# Patient Record
Sex: Female | Born: 1980 | Race: Black or African American | Hispanic: No | Marital: Single | Smoking: Never smoker
Health system: Southern US, Community
[De-identification: ages and names within clinical notes are randomized; demographics above are authoritative.]

## PROBLEM LIST (undated history)

## (undated) DIAGNOSIS — F418 Other specified anxiety disorders: Secondary | ICD-10-CM

## (undated) DIAGNOSIS — J069 Acute upper respiratory infection, unspecified: Secondary | ICD-10-CM

## (undated) DIAGNOSIS — H6092 Unspecified otitis externa, left ear: Secondary | ICD-10-CM

## (undated) DIAGNOSIS — L509 Urticaria, unspecified: Secondary | ICD-10-CM

## (undated) DIAGNOSIS — E663 Overweight: Principal | ICD-10-CM

## (undated) DIAGNOSIS — B019 Varicella without complication: Secondary | ICD-10-CM

## (undated) DIAGNOSIS — R030 Elevated blood-pressure reading, without diagnosis of hypertension: Secondary | ICD-10-CM

## (undated) DIAGNOSIS — D649 Anemia, unspecified: Secondary | ICD-10-CM

## (undated) DIAGNOSIS — F419 Anxiety disorder, unspecified: Secondary | ICD-10-CM

## (undated) HISTORY — DX: Elevated blood-pressure reading, without diagnosis of hypertension: R03.0

## (undated) HISTORY — DX: Other specified anxiety disorders: F41.8

## (undated) HISTORY — DX: Varicella without complication: B01.9

## (undated) HISTORY — DX: Acute upper respiratory infection, unspecified: J06.9

## (undated) HISTORY — DX: Anemia, unspecified: D64.9

## (undated) HISTORY — DX: Anxiety disorder, unspecified: F41.9

## (undated) HISTORY — DX: Urticaria, unspecified: L50.9

## (undated) HISTORY — DX: Unspecified otitis externa, left ear: H60.92

## (undated) HISTORY — DX: Overweight: E66.3

---

## 2006-01-13 HISTORY — PX: DILATION AND CURETTAGE OF UTERUS: SHX78

## 2010-01-29 LAB — HM PAP SMEAR

## 2011-08-10 ENCOUNTER — Encounter: Payer: Self-pay | Admitting: Family Medicine

## 2011-08-10 ENCOUNTER — Other Ambulatory Visit: Payer: Self-pay | Admitting: Family Medicine

## 2011-08-10 ENCOUNTER — Ambulatory Visit (INDEPENDENT_AMBULATORY_CARE_PROVIDER_SITE_OTHER): Payer: 59 | Admitting: Family Medicine

## 2011-08-10 DIAGNOSIS — IMO0001 Reserved for inherently not codable concepts without codable children: Secondary | ICD-10-CM

## 2011-08-10 DIAGNOSIS — D649 Anemia, unspecified: Secondary | ICD-10-CM | POA: Insufficient documentation

## 2011-08-10 DIAGNOSIS — R030 Elevated blood-pressure reading, without diagnosis of hypertension: Secondary | ICD-10-CM

## 2011-08-10 DIAGNOSIS — Z0000 Encounter for general adult medical examination without abnormal findings: Secondary | ICD-10-CM

## 2011-08-10 DIAGNOSIS — E663 Overweight: Secondary | ICD-10-CM

## 2011-08-10 HISTORY — DX: Reserved for inherently not codable concepts without codable children: IMO0001

## 2011-08-10 HISTORY — DX: Overweight: E66.3

## 2011-08-10 NOTE — Assessment & Plan Note (Signed)
Given paperwork on DASH diet.

## 2011-08-10 NOTE — Assessment & Plan Note (Signed)
Encouraged fasting labs and consider a flud and tetanus shof.

## 2011-08-10 NOTE — Patient Instructions (Signed)

## 2011-08-10 NOTE — Assessment & Plan Note (Signed)
Consider DASH diet and increased activiy

## 2011-08-10 NOTE — Progress Notes (Signed)
Patient ID: Sara Vincent, female   DOB: 28-Mar-1981, 31 y.o.   MRN: [id number] Sara Vincent [id number] 1981-04-20 08/10/2011      Progress Note New Patient  Subjective  Chief Complaint  Chief Complaint  Patient presents with  . Establish Care    new patient    HPI  Patient is a 30yo female who is in today for new patient appointment. She is  struggling with fatigue and malaise. , Palpitations, shortness of breath. At home with her 90-year-old daughter her parents. And had her last Pap over a year ago. Is not having any GYN or breast complaints. During her pregnancy she did have preeclampsia but that result.  Past Medical History  Diagnosis Date  . Chicken pox as a child  . Anemia   . Overweight 08/10/2011  . Elevated BP 08/10/2011    Past Surgical History  Procedure Date  . Dilation and curettage of uterus 01-2006    Family History  Problem Relation Age of Onset  . Hypertension Mother   . Cancer Father 4    prostate- remission- surgery to remove  . Heart disease Maternal Grandfather   . Hypertension Maternal Grandfather   . Hyperlipidemia Maternal Grandfather   . Stroke Paternal Grandmother   . Cancer Maternal Aunt     breast  . Cancer Paternal Aunt     breast    History   Social History  . Marital Status: Single    Spouse Name: N/A    Number of Children: N/A  . Years of Education: N/A   Occupational History  . Not on file.   Social History Main Topics  . Smoking status: Never Smoker   . Smokeless tobacco: Never Used  . Alcohol Use: Yes     occasionally  . Drug Use: No  . Sexually Active: No   Other Topics Concern  . Not on file   Social History Narrative  . No narrative on file    No current outpatient prescriptions on file prior to visit.    No Known Allergies  Review of Systems  Review of Systems  Constitutional: Negative for fever and malaise/fatigue.  HENT: Negative for congestion.   Eyes: Negative for discharge.  Respiratory: Negative  for shortness of breath.   Cardiovascular: Negative for chest pain, palpitations and leg swelling.  Gastrointestinal: Negative for nausea, abdominal pain and diarrhea.  Genitourinary: Negative for dysuria.  Musculoskeletal: Negative for falls.  Skin: Negative for rash.  Neurological: Negative for loss of consciousness and headaches.  Endo/Heme/Allergies: Negative for polydipsia.  Psychiatric/Behavioral: Negative for depression and suicidal ideas. The patient is not nervous/anxious and does not have insomnia.      Objective  BP 138/91  Pulse 79  Temp(Src) 98.2 F (36.8 C) (Temporal)  Ht 5\' 4"  (1.626 m)  Wt 206 lb 1.9 oz (93.495 kg)  BMI 35.38 kg/m2  SpO2 99%  LMP 08/10/2011  Physical Exam  Physical Exam     Assessment & Plan  Preventative health care Encouraged fasting labs and consider a flud and tetanus shof.  Elevated BP Consider DASH diet and increased activiy  Overweight Given paperwork on DASH diet.

## 2011-08-11 LAB — HEPATIC FUNCTION PANEL
ALT: 10 U/L (ref 0–35)
Albumin: 4.2 g/dL (ref 3.5–5.2)
Indirect Bilirubin: 0.3 mg/dL (ref 0.0–0.9)
Total Protein: 7.9 g/dL (ref 6.0–8.3)

## 2011-08-11 LAB — BASIC METABOLIC PANEL
Chloride: 105 mEq/L (ref 96–112)
Potassium: 3.9 mEq/L (ref 3.5–5.3)
Sodium: 138 mEq/L (ref 135–145)

## 2011-08-11 LAB — CBC
MCHC: 33.1 g/dL (ref 30.0–36.0)
Platelets: 316 10*3/uL (ref 150–400)
RDW: 12.1 % (ref 11.5–15.5)
WBC: 5.1 10*3/uL (ref 4.0–10.5)

## 2011-08-11 LAB — LIPID PANEL
Cholesterol: 151 mg/dL (ref 0–200)
HDL: 49 mg/dL (ref >39)
LDL Cholesterol: 83 mg/dL (ref 0–99)
Total CHOL/HDL Ratio: 3.1 Ratio
Triglycerides: 93 mg/dL (ref <150)
VLDL: 19 mg/dL (ref 0–40)

## 2011-08-11 LAB — PHOSPHORUS: Phosphorus: 2.7 mg/dL (ref 2.3–4.6)

## 2011-08-11 LAB — TSH: TSH: 0.939 u[IU]/mL (ref 0.350–4.500)

## 2011-09-07 ENCOUNTER — Ambulatory Visit: Payer: 59 | Admitting: Family Medicine

## 2011-09-25 ENCOUNTER — Other Ambulatory Visit (HOSPITAL_COMMUNITY)
Admission: RE | Admit: 2011-09-25 | Discharge: 2011-09-25 | Disposition: A | Discharge status: Home / Self Care | Payer: 59 | Source: Ambulatory Visit | Attending: Family Medicine | Admitting: Family Medicine

## 2011-09-25 ENCOUNTER — Ambulatory Visit (INDEPENDENT_AMBULATORY_CARE_PROVIDER_SITE_OTHER): Payer: 59 | Admitting: Family Medicine

## 2011-09-25 ENCOUNTER — Encounter: Payer: Self-pay | Admitting: Family Medicine

## 2011-09-25 VITALS — BP 116/79 | HR 74 | Temp 98.9°F | Ht 64.0 in | Wt 206.1 lb

## 2011-09-25 DIAGNOSIS — E663 Overweight: Secondary | ICD-10-CM

## 2011-09-25 DIAGNOSIS — R030 Elevated blood-pressure reading, without diagnosis of hypertension: Secondary | ICD-10-CM

## 2011-09-25 DIAGNOSIS — Z0000 Encounter for general adult medical examination without abnormal findings: Secondary | ICD-10-CM

## 2011-09-25 DIAGNOSIS — Z124 Encounter for screening for malignant neoplasm of cervix: Secondary | ICD-10-CM

## 2011-09-25 DIAGNOSIS — D649 Anemia, unspecified: Secondary | ICD-10-CM

## 2011-09-25 DIAGNOSIS — Z01419 Encounter for gynecological examination (general) (routine) without abnormal findings: Secondary | ICD-10-CM | POA: Insufficient documentation

## 2011-09-25 NOTE — Assessment & Plan Note (Signed)
Very mild, continue dark leafy greens and lean red meats intermittently

## 2011-09-25 NOTE — Assessment & Plan Note (Signed)
Encouraged annual paps, self breast exam, heart healthy diet, regular exercise

## 2011-09-25 NOTE — Assessment & Plan Note (Signed)
Avoid trans fats, decrease po intake and increase exercise, check tsh

## 2011-09-25 NOTE — Progress Notes (Signed)
Patient ID: Sara Vincent, female   DOB: 09-11-1980, 31 y.o.   MRN: [id number] Sara Vincent [id number] December 10, 1980 09/25/2011      Progress Note New Patient  Subjective  Chief Complaint  Chief Complaint  Patient presents with  . Gynecologic Exam    pap  . Annual Exam    physical    HPI  Patient is a 31 year old female who is in today for followup on new patient appointment and GYN exam. She reports she is doing well. No recent illness, fevers, chills, GI or GU complaints. Her bowels are moving regularly. She is denying any GYN complaints. No discharge or lesions. She's not been sexually active since 2001. She denies any breast complaints lumps bumps or concerns. She continues to show appropriate he is walking he does note overall she feels better. Her level of the aunt her joints feel better no concerns.  Past Medical History  Diagnosis Date  . Chicken pox as a child  . Anemia   . Overweight 08/10/2011  . Elevated BP 08/10/2011    Past Surgical History  Procedure Date  . Dilation and curettage of uterus 01-2006    Family History  Problem Relation Age of Onset  . Hypertension Mother   . Cancer Father 71    prostate- remission- surgery to remove  . Heart disease Maternal Grandfather   . Hypertension Maternal Grandfather   . Hyperlipidemia Maternal Grandfather   . Stroke Paternal Grandmother   . Cancer Maternal Aunt     breast  . Cancer Paternal Aunt     breast    History   Social History  . Marital Status: Single    Spouse Name: N/A    Number of Children: N/A  . Years of Education: N/A   Occupational History  . Not on file.   Social History Main Topics  . Smoking status: Never Smoker   . Smokeless tobacco: Never Used  . Alcohol Use: Yes     occasionally  . Drug Use: No  . Sexually Active: No   Other Topics Concern  . Not on file   Social History Narrative  . No narrative on file    Current Outpatient Prescriptions on File Prior to Visit  Medication  Sig Dispense Refill  . fish oil-omega-3 fatty acids 1000 MG capsule Take 2 g by mouth daily.      . folic acid (FOLVITE) 1 MG tablet Take 1 mg by mouth daily.      . IUD's (PARAGARD INTRAUTERINE COPPER IU) by Intrauterine route. Due to come out May 2020      . multivitamin (THERAGRAN) per tablet Take 1 tablet by mouth daily.      . vitamin B-12 (CYANOCOBALAMIN) 500 MCG tablet Take 500 mcg by mouth daily.        No Known Allergies  Review of Systems  Review of Systems  Constitutional: Positive for malaise/fatigue. Negative for fever and chills.  HENT: Negative for hearing loss, nosebleeds and congestion.   Eyes: Negative for discharge.  Respiratory: Negative for cough, sputum production, shortness of breath and wheezing.   Cardiovascular: Negative for chest pain, palpitations and leg swelling.  Gastrointestinal: Negative for heartburn, nausea, vomiting, abdominal pain, diarrhea, constipation and blood in stool.  Genitourinary: Negative for dysuria, urgency, frequency and hematuria.  Musculoskeletal: Negative for myalgias, back pain and falls.  Skin: Negative for rash.  Neurological: Negative for dizziness, tremors, sensory change, focal weakness, loss of consciousness, weakness and headaches.  Endo/Heme/Allergies: Negative  for polydipsia. Does not bruise/bleed easily.  Psychiatric/Behavioral: Negative for depression and suicidal ideas. The patient is not nervous/anxious and does not have insomnia.     Objective  BP 116/79  Pulse 74  Temp(Src) 98.9 F (37.2 C) (Temporal)  Ht 5\' 4"  (1.626 m)  Wt 206 lb 1.9 oz (93.495 kg)  BMI 35.38 kg/m2  SpO2 97%  LMP 09/06/2011  Physical Exam  Physical Exam  Constitutional: She is oriented to person, place, and time and well-developed, well-nourished, and in no distress. No distress.  HENT:  Head: Normocephalic and atraumatic.  Right Ear: External ear normal.  Left Ear: External ear normal.  Nose: Nose normal.  Mouth/Throat: Oropharynx  is clear and moist. No oropharyngeal exudate.  Eyes: Conjunctivae are normal. Pupils are equal, round, and reactive to light. Right eye exhibits no discharge. Left eye exhibits no discharge. No scleral icterus.  Neck: Normal range of motion. Neck supple. No thyromegaly present.  Cardiovascular: Normal rate, regular rhythm, normal heart sounds and intact distal pulses.   No murmur heard. Pulmonary/Chest: Effort normal and breath sounds normal. No respiratory distress. She has no wheezes. She has no rales.  Abdominal: Soft. Bowel sounds are normal. She exhibits no distension and no mass. There is no tenderness.  Genitourinary: Uterus normal, cervix normal, right adnexa normal and left adnexa normal. No vaginal discharge found.       String from uterus seen in cervix  Musculoskeletal: Normal range of motion. She exhibits no edema and no tenderness.  Lymphadenopathy:    She has no cervical adenopathy.  Neurological: She is alert and oriented to person, place, and time. She has normal reflexes. No cranial nerve deficit. Coordination normal.  Skin: Skin is warm and dry. No rash noted. She is not diaphoretic.  Psychiatric: Mood, memory and affect normal.       Assessment & Plan  Cervical cancer screening No GYN concerns, IUD in place. Not sexually active for the past 2 years and no concerns. Pap taken today, instructed to do self breast exam monthly.  Elevated BP Improved at today's visit. Continue to walk and minimize sodium  Overweight Avoid trans fats, decrease po intake and increase exercise, check tsh  Anemia Very mild, continue dark leafy greens and lean red meats intermittently  Preventative health care Encouraged annual paps, self breast exam, heart healthy diet, regular exercise

## 2011-09-25 NOTE — Assessment & Plan Note (Addendum)
Improved at today's visit. Continue to walk and minimize sodium

## 2011-09-25 NOTE — Assessment & Plan Note (Addendum)
No GYN concerns, IUD in place. Not sexually active for the past 2 years and no concerns. Pap taken today, instructed to do self breast exam monthly.

## 2011-09-25 NOTE — Patient Instructions (Signed)

## 2011-10-16 ENCOUNTER — Encounter: Payer: Self-pay | Admitting: Family Medicine

## 2011-10-16 ENCOUNTER — Ambulatory Visit (INDEPENDENT_AMBULATORY_CARE_PROVIDER_SITE_OTHER): Payer: 59 | Admitting: Family Medicine

## 2011-10-16 VITALS — BP 143/100 | HR 80 | Temp 98.2°F | Ht 64.0 in | Wt 212.8 lb

## 2011-10-16 DIAGNOSIS — E663 Overweight: Secondary | ICD-10-CM

## 2011-10-16 DIAGNOSIS — H60399 Other infective otitis externa, unspecified ear: Secondary | ICD-10-CM

## 2011-10-16 DIAGNOSIS — H6090 Unspecified otitis externa, unspecified ear: Secondary | ICD-10-CM

## 2011-10-16 DIAGNOSIS — R030 Elevated blood-pressure reading, without diagnosis of hypertension: Secondary | ICD-10-CM

## 2011-10-16 DIAGNOSIS — H6690 Otitis media, unspecified, unspecified ear: Secondary | ICD-10-CM

## 2011-10-16 DIAGNOSIS — H6092 Unspecified otitis externa, left ear: Secondary | ICD-10-CM

## 2011-10-16 DIAGNOSIS — IMO0001 Reserved for inherently not codable concepts without codable children: Secondary | ICD-10-CM

## 2011-10-16 HISTORY — DX: Unspecified otitis externa, left ear: H60.92

## 2011-10-16 MED ORDER — AMOXICILLIN-POT CLAVULANATE 875-125 MG PO TABS: 1.0000 | ORAL_TABLET | Freq: Two times a day (BID) | ORAL | Status: AC

## 2011-10-16 MED ORDER — CETIRIZINE HCL 10 MG PO TABS: 10.0000 mg | ORAL_TABLET | Freq: Every day | ORAL | Status: AC

## 2011-10-16 MED ORDER — ANTIPYRINE-BENZOCAINE 5.4-1.4 % OT SOLN: 3.0000 [drp] | OTIC | Status: AC | PRN

## 2011-10-16 NOTE — Assessment & Plan Note (Signed)
Some weight gain noted since last visit, encouraged increased exercise and decreased po intake

## 2011-10-16 NOTE — Progress Notes (Signed)
Patient ID: Sara Vincent, female   DOB: [birth year], 31 y.o.   MRN: [id number] Sara Vincent [id number] 08/28/1980 10/16/2011      Progress Note-Follow Up  Subjective  Chief Complaint  Chief Complaint  Patient presents with  . Otitis Media    X 5 days-left ear swollen- can't hear out of it    HPI  Patient is a 31 year old [place] American female who is in today with a five-day history of painful swelling in her left ear with decreased hearing. Last week her daughter at about the neurovirus. Then over the weekend the patient herself began to develop a sour stomach and abdominal pain. She had roughly 4 episodes of vomiting and some intermittent loose stool. Had poor appetite and abdominal discomfort most of the week. The symptoms largely resolved but then fatigue started 3-4 days ago and left ear pain occurred at the same time. Over the last several days the ear has become increasingly swollen and uncomfortable. She has had decreased hearing and a sense of popping as well she also notes that allergies are bad this year with itchy watery eyes and nose and increased head congestion. She denies sore throat, chest pain, palpitations, shortness of breath, GU complaints at this time.  Past Medical History  Diagnosis Date  . Chicken pox as a child  . Anemia   . Overweight 08/10/2011  . Elevated BP 08/10/2011  . Otitis externa of left ear 10/16/2011    Past Surgical History  Procedure Date  . Dilation and curettage of uterus 01-2006    Family History  Problem Relation Age of Onset  . Hypertension Mother   . Cancer Father 47    prostate- remission- surgery to remove  . Heart disease Maternal Grandfather   . Hypertension Maternal Grandfather   . Hyperlipidemia Maternal Grandfather   . Stroke Paternal Grandmother   . Cancer Maternal Aunt     breast  . Cancer Paternal Aunt     breast    History   Social History  . Marital Status: Single    Spouse Name: N/A    Number of Children: N/A  .  Years of Education: N/A   Occupational History  . Not on file.   Social History Main Topics  . Smoking status: Never Smoker   . Smokeless tobacco: Never Used  . Alcohol Use: Yes     occasionally  . Drug Use: No  . Sexually Active: No   Other Topics Concern  . Not on file   Social History Narrative  . No narrative on file    Current Outpatient Prescriptions on File Prior to Visit  Medication Sig Dispense Refill  . folic acid (FOLVITE) 1 MG tablet Take 1 mg by mouth daily.      . IUD's (PARAGARD INTRAUTERINE COPPER IU) by Intrauterine route. Due to come out May 2020      . multivitamin (THERAGRAN) per tablet Take 1 tablet by mouth daily.      . vitamin B-12 (CYANOCOBALAMIN) 500 MCG tablet Take 500 mcg by mouth daily.      . cetirizine (ZYRTEC) 10 MG tablet Take 1 tablet (10 mg total) by mouth daily.  30 tablet  11  . fish oil-omega-3 fatty acids 1000 MG capsule Take 2 g by mouth daily.        No Known Allergies  Review of Systems  Review of Systems  Constitutional: Negative for fever and malaise/fatigue.  HENT: Positive for ear pain, congestion and ear  discharge. Negative for sore throat.   Eyes: Negative for discharge.  Respiratory: Negative for cough and shortness of breath.   Cardiovascular: Negative for chest pain, palpitations and leg swelling.  Gastrointestinal: Positive for nausea, vomiting and diarrhea. Negative for abdominal pain, constipation and blood in stool.       Had norovirus earlier in the week, with n/v/diarrhea and abdominal pain, these symptoms have largely resolved  Genitourinary: Negative for dysuria.  Musculoskeletal: Negative for falls.  Skin: Negative for rash.  Neurological: Negative for loss of consciousness and headaches.  Endo/Heme/Allergies: Negative for polydipsia.  Psychiatric/Behavioral: Negative for depression and suicidal ideas. The patient is not nervous/anxious and does not have insomnia.     Objective  BP 143/100  Pulse 80   Temp(Src) 98.2 F (36.8 C) (Temporal)  Ht 5\' 4"  (1.626 m)  Wt 212 lb 12.8 oz (96.525 kg)  BMI 36.53 kg/m2  SpO2 99%  LMP 10/03/2011  Physical Exam  Physical Exam  Constitutional: She is oriented to person, place, and time and well-developed, well-nourished, and in no distress. No distress.  HENT:  Head: Normocephalic and atraumatic.  Left Ear: External ear normal.       Oropharynx mildly erythematous. Left ear, tragus and external canal swollen and red. Canal obstructed by cerumen. Posterior and anterior cervical lymphadenopathy  Eyes: Conjunctivae are normal.  Neck: Neck supple. No thyromegaly present.  Cardiovascular: Normal rate, regular rhythm and normal heart sounds.   No murmur heard. Pulmonary/Chest: Effort normal and breath sounds normal. She has no wheezes.  Abdominal: She exhibits no distension and no mass.  Musculoskeletal: She exhibits no edema.  Lymphadenopathy:    She has cervical adenopathy.  Neurological: She is alert and oriented to person, place, and time.  Skin: Skin is warm and dry. No rash noted. She is not diaphoretic.  Psychiatric: Memory, affect and judgment normal.    Lab Results  Component Value Date   TSH 0.939 08/10/2011   Lab Results  Component Value Date   WBC 5.1 08/10/2011   HGB 12.0 08/10/2011   HCT 36.3 08/10/2011   MCV 87.1 08/10/2011   PLT 316 08/10/2011   Lab Results  Component Value Date   CREATININE 0.63 08/10/2011   BUN 6 08/10/2011   NA 138 08/10/2011   K 3.9 08/10/2011   CL 105 08/10/2011   CO2 23 08/10/2011   Lab Results  Component Value Date   ALT 10 08/10/2011   AST 14 08/10/2011   ALKPHOS 59 08/10/2011   BILITOT 0.4 08/10/2011   Lab Results  Component Value Date   CHOL 151 08/10/2011   Lab Results  Component Value Date   HDL 49 08/10/2011   Lab Results  Component Value Date   LDLCALC 83 08/10/2011   Lab Results  Component Value Date   TRIG 93 08/10/2011   Lab Results  Component Value Date   CHOLHDL 3.1 08/10/2011      Assessment & Plan  Otitis externa of left ear Possible OM with significant lymphadenopathy in surrounding area. Started on Augmentin bid and auralgan drops. Start a probiotic and instructed how to flush her ear of the cerumen when she feels better. If she is unsuccessful or not improving she will notify us  Overweight Some weight gain noted since last visit, encouraged increased exercise and decreased po intake  Elevated BP Improved on repeat but still up encouraged her to minimize sodium attempt moderate weight loss and recheck in 3 months or as needed

## 2011-10-16 NOTE — Patient Instructions (Addendum)
Otitis Externa Otitis externa ("swimmer's ear") is a germ (bacterial) or fungal infection of the outer ear canal (from the eardrum to the outside of the ear). Swimming in dirty water may cause swimmer's ear. It also may be caused by moisture in the ear from water remaining after swimming or bathing. Often the first signs of infection may be itching in the ear canal. This may progress to ear canal swelling, redness, and pus drainage, which may be signs of infection. HOME CARE INSTRUCTIONS   Apply the antibiotic drops to the ear canal as prescribed by your doctor.   This can be a very painful medical condition. A strong pain reliever may be prescribed.   Only take over-the-counter or prescription medicines for pain, discomfort, or fever as directed by your caregiver.   If your caregiver has given you a follow-up appointment, it is very important to keep that appointment. Not keeping the appointment could result in a chronic or permanent injury, pain, hearing loss and disability. If there is any problem keeping the appointment, you must call back to this facility for assistance.  PREVENTION   It is important to keep your ear dry. Use the corner of a towel to wick water out of the ear canal after swimming or bathing.   Avoid scratching in your ear. This can damage the ear canal or remove the protective wax lining the canal and make it easier for germs (bacteria) or a fungus to grow.   You may use ear drops made of rubbing alcohol and vinegar after swimming to prevent future "swimmer's ear" infections. Make up a small bottle of equal parts white vinegar and alcohol. Put 3 or 4 drops into each ear after swimming.   Avoid swimming in lakes, polluted water, or poorly chlorinated pools.  SEEK MEDICAL CARE IF:   An oral temperature above 102 F (38.9 C) develops.   Your ear is still painful after 3 days and shows signs of getting worse (redness, swelling, pain, or pus).  MAKE SURE YOU:   Understand  these instructions.   Will watch your condition.   Will get help right away if you are not doing well or get worse.  Document Released: 06/01/2005 Document Revised: 05/21/2011 Document Reviewed: 01/06/2008 [facility] Patient Information 2012 [place], [place]. Probiotic capsule daily, yogurt daily

## 2011-10-16 NOTE — Assessment & Plan Note (Signed)
Improved on repeat but still up encouraged her to minimize sodium attempt moderate weight loss and recheck in 3 months or as needed

## 2011-10-16 NOTE — Assessment & Plan Note (Signed)
Possible OM with significant lymphadenopathy in surrounding area. Started on Augmentin bid and auralgan drops. Start a probiotic and instructed how to flush her ear of the cerumen when she feels better. If she is unsuccessful or not improving she will notify us

## 2012-04-04 ENCOUNTER — Ambulatory Visit: Payer: 59 | Admitting: Family Medicine

## 2012-04-04 ENCOUNTER — Other Ambulatory Visit (HOSPITAL_COMMUNITY)
Admission: RE | Admit: 2012-04-04 | Discharge: 2012-04-04 | Disposition: A | Discharge status: Home / Self Care | Payer: 59 | Source: Ambulatory Visit | Attending: Family Medicine | Admitting: Family Medicine

## 2012-04-04 ENCOUNTER — Ambulatory Visit (INDEPENDENT_AMBULATORY_CARE_PROVIDER_SITE_OTHER): Payer: 59 | Admitting: Family Medicine

## 2012-04-04 ENCOUNTER — Encounter: Payer: Self-pay | Admitting: Family Medicine

## 2012-04-04 VITALS — BP 128/81 | HR 90 | Temp 98.2°F | Ht 64.0 in | Wt 217.0 lb

## 2012-04-04 DIAGNOSIS — Z124 Encounter for screening for malignant neoplasm of cervix: Secondary | ICD-10-CM

## 2012-04-04 DIAGNOSIS — R030 Elevated blood-pressure reading, without diagnosis of hypertension: Secondary | ICD-10-CM

## 2012-04-04 DIAGNOSIS — Z113 Encounter for screening for infections with a predominantly sexual mode of transmission: Secondary | ICD-10-CM | POA: Insufficient documentation

## 2012-04-04 DIAGNOSIS — N760 Acute vaginitis: Secondary | ICD-10-CM

## 2012-04-04 DIAGNOSIS — R109 Unspecified abdominal pain: Secondary | ICD-10-CM

## 2012-04-04 DIAGNOSIS — R509 Fever, unspecified: Secondary | ICD-10-CM

## 2012-04-04 LAB — RPR

## 2012-04-04 LAB — HIV ANTIBODY (ROUTINE TESTING W REFLEX): HIV: NONREACTIVE

## 2012-04-04 NOTE — Patient Instructions (Addendum)
Intrauterine Device Information  An intrauterine device (IUD) is inserted into your uterus and prevents pregnancy. There are 2 types of IUDs available:  · Copper IUD. This type of IUD is wrapped in copper wire and is placed inside the uterus. Copper makes the uterus and fallopian tubes produce a fluid that kills sperm. The copper IUD can stay in place for 10 years.  · Hormone IUD. This type of IUD contains the hormone progestin (synthetic progesterone). The hormone thickens the cervical mucus and prevents sperm from entering the uterus, and it also thins the uterine lining to prevent implantation of a fertilized egg. The hormone can weaken or kill the sperm that get into the uterus. The hormone IUD can stay in place for 5 years.  Your caregiver will make sure you are a good candidate for a contraceptive IUD. Discuss with your caregiver the possible side effects.  ADVANTAGES  · It is highly effective, reversible, long-acting, and low maintenance.  · There are no estrogen-related side effects.  · An IUD can be used when breastfeeding.  · It is not associated with weight gain.  · It works immediately after insertion.  · The copper IUD does not interfere with your female hormones.  · The progesterone IUD can make heavy menstrual periods lighter.  · The progesterone IUD can be used for 5 years.  · The copper IUD can be used for 10 years.  DISADVANTAGES  · The progesterone IUD can be associated with irregular bleeding patterns.  · The copper IUD can make your menstrual flow heavier and more painful.  · You may experience cramping and vaginal bleeding after insertion.  Document Released: 05/05/2004 Document Revised: 08/24/2011 Document Reviewed: 10/04/2010  [employer]® Patient Information ©2013 [employer].

## 2012-04-07 MED ORDER — METRONIDAZOLE 500 MG PO TABS: 500.0000 mg | ORAL_TABLET | Freq: Two times a day (BID) | ORAL | Status: DC

## 2012-04-07 NOTE — Progress Notes (Signed)
Quick Note:  Patient Informed and voiced understanding ______ 

## 2012-04-10 ENCOUNTER — Encounter: Payer: Self-pay | Admitting: Family Medicine

## 2012-04-10 NOTE — Assessment & Plan Note (Signed)
Well controlled today.

## 2012-04-10 NOTE — Assessment & Plan Note (Signed)
iud removed today, tolerated well. Patient is going to try going without any birth control. She is getting married and considering a future pregnancy. Cultures taken today.

## 2012-04-10 NOTE — Progress Notes (Signed)
Patient ID: Sara Vincent, female   DOB: 02-19-1981, 31 y.o.   MRN: [id number] DRAVEN MERANTE [id number] 1981/03/29 04/10/2012      Progress Note New Patient  Subjective  Chief Complaint  Chief Complaint  Patient presents with  . IUD removal    HPI  Patient is a 31 year old [place] American female who is here today to have her IUD removed. She is getting married soon is considering another pregnancy. She does have some vaginal discharge but it is not have a significant odor or color. She is actively with a single partner. Denies abdominal or back pain. No GI or GU complaints. No headaches, chest pain, palpitations, shortness of breath.  Past Medical History  Diagnosis Date  . Chicken pox as a child  . Anemia   . Overweight 08/10/2011  . Elevated BP 08/10/2011  . Otitis externa of left ear 10/16/2011    Past Surgical History  Procedure Date  . Dilation and curettage of uterus 01-2006    Family History  Problem Relation Age of Onset  . Hypertension Mother   . Cancer Father 16    prostate- remission- surgery to remove  . Heart disease Maternal Grandfather   . Hypertension Maternal Grandfather   . Hyperlipidemia Maternal Grandfather   . Stroke Paternal Grandmother   . Cancer Maternal Aunt     breast  . Cancer Paternal Aunt     breast    History   Social History  . Marital Status: Single    Spouse Name: N/A    Number of Children: N/A  . Years of Education: N/A   Occupational History  . Not on file.   Social History Main Topics  . Smoking status: Never Smoker   . Smokeless tobacco: Never Used  . Alcohol Use: Yes     occasionally  . Drug Use: No  . Sexually Active: No   Other Topics Concern  . Not on file   Social History Narrative  . No narrative on file    Current Outpatient Prescriptions on File Prior to Visit  Medication Sig Dispense Refill  . cetirizine (ZYRTEC) 10 MG tablet Take 1 tablet (10 mg total) by mouth daily.  30 tablet  11  . fish oil-omega-3  fatty acids 1000 MG capsule Take 2 g by mouth daily.      . folic acid (FOLVITE) 1 MG tablet Take 1 mg by mouth daily.      . multivitamin (THERAGRAN) per tablet Take 1 tablet by mouth daily.      . vitamin B-12 (CYANOCOBALAMIN) 500 MCG tablet Take 500 mcg by mouth daily.        No Known Allergies  Review of Systems  Review of Systems  Constitutional: Negative for fever and malaise/fatigue.  HENT: Negative for congestion.   Eyes: Negative for discharge.  Respiratory: Negative for shortness of breath.   Cardiovascular: Negative for chest pain, palpitations and leg swelling.  Gastrointestinal: Negative for nausea, abdominal pain and diarrhea.  Genitourinary: Negative for dysuria.  Musculoskeletal: Negative for falls.  Skin: Negative for rash.  Neurological: Negative for loss of consciousness and headaches.  Endo/Heme/Allergies: Negative for polydipsia.  Psychiatric/Behavioral: Negative for depression and suicidal ideas. The patient is not nervous/anxious and does not have insomnia.     Objective  BP 128/81  Pulse 90  Temp 98.2 F (36.8 C) (Temporal)  Ht 5\' 4"  (1.626 m)  Wt 217 lb (98.431 kg)  BMI 37.25 kg/m2  SpO2 100%  LMP 03/19/2012  Physical Exam  Physical Exam  Constitutional: She is oriented to person, place, and time and well-developed, well-nourished, and in no distress. No distress.  HENT:  Head: Normocephalic and atraumatic.  Eyes: Conjunctivae normal are normal.  Neck: Neck supple. No thyromegaly present.  Cardiovascular: Normal rate, regular rhythm and normal heart sounds.   No murmur heard. Pulmonary/Chest: Effort normal and breath sounds normal. She has no wheezes.  Abdominal: She exhibits no distension and no mass.  Genitourinary: Uterus normal, cervix normal, right adnexa normal and left adnexa normal. No vaginal discharge found.       Thick white discharge  Musculoskeletal: She exhibits no edema.  Lymphadenopathy:    She has no cervical adenopathy.    Neurological: She is alert and oriented to person, place, and time.  Skin: Skin is warm and dry. No rash noted. She is not diaphoretic.  Psychiatric: Memory, affect and judgment normal.       Assessment & Plan  Cervical cancer screening iud removed today, tolerated well. Patient is going to try going without any birth control. She is getting married and considering a future pregnancy. Cultures taken today.  Elevated BP Well controlled today

## 2012-06-28 ENCOUNTER — Encounter: Payer: Self-pay | Admitting: Family Medicine

## 2012-06-28 ENCOUNTER — Ambulatory Visit (INDEPENDENT_AMBULATORY_CARE_PROVIDER_SITE_OTHER): Payer: 59 | Admitting: Family Medicine

## 2012-06-28 VITALS — BP 131/81 | HR 102 | Temp 98.9°F | Ht 64.0 in | Wt 217.4 lb

## 2012-06-28 DIAGNOSIS — R030 Elevated blood-pressure reading, without diagnosis of hypertension: Secondary | ICD-10-CM

## 2012-06-28 DIAGNOSIS — F411 Generalized anxiety disorder: Secondary | ICD-10-CM

## 2012-06-28 DIAGNOSIS — F419 Anxiety disorder, unspecified: Secondary | ICD-10-CM

## 2012-06-28 MED ORDER — ALPRAZOLAM 0.25 MG PO TABS: 0.2500 mg | ORAL_TABLET | Freq: Two times a day (BID) | ORAL | Status: DC | PRN

## 2012-06-28 NOTE — Patient Instructions (Addendum)

## 2012-06-29 ENCOUNTER — Encounter: Payer: Self-pay | Admitting: Family Medicine

## 2012-06-29 DIAGNOSIS — F418 Other specified anxiety disorders: Secondary | ICD-10-CM | POA: Insufficient documentation

## 2012-06-29 DIAGNOSIS — F419 Anxiety disorder, unspecified: Secondary | ICD-10-CM

## 2012-06-29 HISTORY — DX: Anxiety disorder, unspecified: F41.9

## 2012-06-29 HISTORY — DX: Other specified anxiety disorders: F41.8

## 2012-06-29 NOTE — Progress Notes (Signed)
Patient ID: Sara Vincent, female   DOB: Oct 30, 1980, 32 y.o.   MRN: [id number] Sara Vincent [id number] 1980-08-03 06/29/2012      Progress Note-Follow Up  Subjective  Chief Complaint  Chief Complaint  Patient presents with  . Stress    at work    HPI  Patient is a 32 year old American female who is in today to discuss worsening stressors. She is working in a very hostile work environment and under great stress. She cries easily. She's been having daily panic attacks recently palpitations shortness of breath tremulousness even some tingling in her fingertips. She has never experienced this before. She denies active depression but acknowledges she is struggling with some anhedonia. No other acute illness or other physical changes are noted. No GI or GU complaints. No fevers congestion headaches. Work became significantly more stressful back in September and initially she felt she was getting through it okay but at this point she is having a hard time coping.  Past Medical History  Diagnosis Date  . Chicken pox as a child  . Anemia   . Overweight 08/10/2011  . Elevated BP 08/10/2011  . Otitis externa of left ear 10/16/2011  . Anxiety 06/29/2012    Past Surgical History  Procedure Date  . Dilation and curettage of uterus 01-2006    Family History  Problem Relation Age of Onset  . Hypertension Mother   . Cancer Father 49    prostate- remission- surgery to remove  . Heart disease Maternal Grandfather   . Hypertension Maternal Grandfather   . Hyperlipidemia Maternal Grandfather   . Stroke Paternal Grandmother   . Cancer Maternal Aunt     breast  . Cancer Paternal Aunt     breast    History   Social History  . Marital Status: Single    Spouse Name: N/A    Number of Children: N/A  . Years of Education: N/A   Occupational History  . Not on file.   Social History Main Topics  . Smoking status: Never Smoker   . Smokeless tobacco: Never Used  . Alcohol Use: Yes     Comment:  occasionally  . Drug Use: No  . Sexually Active: No   Other Topics Concern  . Not on file   Social History Narrative  . No narrative on file    Current Outpatient Prescriptions on File Prior to Visit  Medication Sig Dispense Refill  . cetirizine (ZYRTEC) 10 MG tablet Take 1 tablet (10 mg total) by mouth daily.  30 tablet  11  . folic acid (FOLVITE) 1 MG tablet Take 1 mg by mouth daily.      . multivitamin (THERAGRAN) per tablet Take 1 tablet by mouth daily.      . vitamin B-12 (CYANOCOBALAMIN) 500 MCG tablet Take 500 mcg by mouth daily.        No Known Allergies  Review of Systems  Review of Systems  Constitutional: Negative for fever and malaise/fatigue.  HENT: Negative for congestion.   Eyes: Negative for discharge.  Respiratory: Positive for shortness of breath.   Cardiovascular: Positive for palpitations. Negative for chest pain and leg swelling.  Gastrointestinal: Negative for nausea, abdominal pain and diarrhea.  Genitourinary: Negative for dysuria.  Musculoskeletal: Negative for falls.  Skin: Negative for rash.  Neurological: Negative for loss of consciousness and headaches.  Endo/Heme/Allergies: Negative for polydipsia.  Psychiatric/Behavioral: Positive for depression. Negative for suicidal ideas. The patient is nervous/anxious. The patient does not have insomnia.  Objective  BP 131/81  Pulse 102  Temp 98.9 F (37.2 C) (Temporal)  Ht 5\' 4"  (1.626 m)  Wt 217 lb 6.4 oz (98.612 kg)  BMI 37.32 kg/m2  SpO2 99%  Physical Exam  Physical Exam  Constitutional: She is oriented to person, place, and time and well-developed, well-nourished, and in no distress. No distress.  HENT:  Head: Normocephalic and atraumatic.  Eyes: Conjunctivae normal are normal.  Neck: Neck supple. No thyromegaly present.  Cardiovascular: Normal rate, regular rhythm and normal heart sounds.   No murmur heard. Pulmonary/Chest: Effort normal and breath sounds normal. She has no  wheezes.  Abdominal: She exhibits no distension and no mass.  Musculoskeletal: She exhibits no edema.  Lymphadenopathy:    She has no cervical adenopathy.  Neurological: She is alert and oriented to person, place, and time.  Skin: Skin is warm and dry. No rash noted. She is not diaphoretic.  Psychiatric: Memory, affect and judgment normal.       Crying easily in visit.    Lab Results  Component Value Date   TSH 0.939 08/10/2011   Lab Results  Component Value Date   WBC 5.1 08/10/2011   HGB 12.0 08/10/2011   HCT 36.3 08/10/2011   MCV 87.1 08/10/2011   PLT 316 08/10/2011   Lab Results  Component Value Date   CREATININE 0.63 08/10/2011   BUN 6 08/10/2011   NA 138 08/10/2011   K 3.9 08/10/2011   CL 105 08/10/2011   CO2 23 08/10/2011   Lab Results  Component Value Date   ALT 10 08/10/2011   AST 14 08/10/2011   ALKPHOS 59 08/10/2011   BILITOT 0.4 08/10/2011   Lab Results  Component Value Date   CHOL 151 08/10/2011   Lab Results  Component Value Date   HDL 49 08/10/2011   Lab Results  Component Value Date   LDLCALC 83 08/10/2011   Lab Results  Component Value Date   TRIG 93 08/10/2011   Lab Results  Component Value Date   CHOLHDL 3.1 08/10/2011     Assessment & Plan  Elevated BP Well controlled despite increased stress  Anxiety Stressful work situation. Has been getting worse since September. At this point she is crying heavily. He does agree that she will take a two-week leave from work and increase rest and exercise. Consider medications and he is given a small amount of alprazolam to use if she has any full and anxiety or panic attacks. She will bring FMLA forms and we will have her follow up visit in 1-2 weeks to reassess and decide on what plans and whether or not a daily medication is indicated

## 2012-06-29 NOTE — Assessment & Plan Note (Signed)
Stressful work situation. Has been getting worse since September. At this point she is crying heavily. He does agree that she will take a two-week leave from work and increase rest and exercise. Consider medications and he is given a small amount of alprazolam to use if she has any full and anxiety or panic attacks. She will bring FMLA forms and we will have her follow up visit in 1-2 weeks to reassess and decide on what plans and whether or not a daily medication is indicated

## 2012-06-29 NOTE — Assessment & Plan Note (Signed)
Well controlled despite increased stress

## 2012-07-11 ENCOUNTER — Ambulatory Visit (INDEPENDENT_AMBULATORY_CARE_PROVIDER_SITE_OTHER): Payer: 59 | Admitting: Family Medicine

## 2012-07-11 ENCOUNTER — Encounter: Payer: Self-pay | Admitting: Family Medicine

## 2012-07-11 VITALS — BP 120/86 | HR 81 | Temp 98.4°F | Ht 64.0 in | Wt 224.0 lb

## 2012-07-11 DIAGNOSIS — F341 Dysthymic disorder: Secondary | ICD-10-CM

## 2012-07-11 DIAGNOSIS — R030 Elevated blood-pressure reading, without diagnosis of hypertension: Secondary | ICD-10-CM

## 2012-07-11 DIAGNOSIS — F419 Anxiety disorder, unspecified: Secondary | ICD-10-CM

## 2012-07-11 DIAGNOSIS — F418 Other specified anxiety disorders: Secondary | ICD-10-CM

## 2012-07-11 DIAGNOSIS — IMO0001 Reserved for inherently not codable concepts without codable children: Secondary | ICD-10-CM

## 2012-07-11 MED ORDER — ESCITALOPRAM OXALATE 10 MG PO TABS: 10.0000 mg | ORAL_TABLET | Freq: Every day | ORAL | Status: DC

## 2012-07-11 NOTE — Assessment & Plan Note (Signed)
Patient still struggling despite short leave from work. Agrees to start Lexapro daily and Alprazolam may be continued prn. She has set up an appt with counselor and we will keep her out of work for the next month.

## 2012-07-11 NOTE — Patient Instructions (Addendum)
DASH diet

## 2012-07-11 NOTE — Assessment & Plan Note (Signed)
Adequate control. 

## 2012-07-11 NOTE — Progress Notes (Signed)
Patient ID: Sara Vincent, female   DOB: Oct 20, 1980, 32 y.o.   MRN: [id number] Sara Vincent [id number] 1981-06-05 07/11/2012      Progress Note-Follow Up  Subjective  Chief Complaint  Chief Complaint  Patient presents with  . Follow-up    HPI  32 year old female who is in today for followup on her anxiety and depression. She's been out of work the past 2 weeks but still struggles with palpitations and anxiety. Difficulty concentrating and anhedonia also noted. No suicidal ideation but some increased irritability and difficulty concentrating are noted. Does have some palpitations at times but no shortness or breath or chest pain. No GI or GU complaints.  Past Medical History  Diagnosis Date  . Chicken pox as a child  . Anemia   . Overweight 08/10/2011  . Elevated BP 08/10/2011  . Otitis externa of left ear 10/16/2011  . Anxiety 06/29/2012  . Depression with anxiety 06/29/2012    Past Surgical History  Procedure Date  . Dilation and curettage of uterus 01-2006    Family History  Problem Relation Age of Onset  . Hypertension Mother   . Cancer Father 55    prostate- remission- surgery to remove  . Heart disease Maternal Grandfather   . Hypertension Maternal Grandfather   . Hyperlipidemia Maternal Grandfather   . Stroke Paternal Grandmother   . Cancer Maternal Aunt     breast  . Cancer Paternal Aunt     breast    History   Social History  . Marital Status: Single    Spouse Name: N/A    Number of Children: N/A  . Years of Education: N/A   Occupational History  . Not on file.   Social History Main Topics  . Smoking status: Never Smoker   . Smokeless tobacco: Never Used  . Alcohol Use: Yes     Comment: occasionally  . Drug Use: No  . Sexually Active: No   Other Topics Concern  . Not on file   Social History Narrative  . No narrative on file    Current Outpatient Prescriptions on File Prior to Visit  Medication Sig Dispense Refill  . ALPRAZolam (XANAX) 0.25 MG  tablet Take 1 tablet (0.25 mg total) by mouth 2 (two) times daily as needed for sleep or anxiety.  10 tablet  0  . cetirizine (ZYRTEC) 10 MG tablet Take 1 tablet (10 mg total) by mouth daily.  30 tablet  11  . folic acid (FOLVITE) 1 MG tablet Take 1 mg by mouth daily.      . multivitamin (THERAGRAN) per tablet Take 1 tablet by mouth daily.      . vitamin B-12 (CYANOCOBALAMIN) 500 MCG tablet Take 500 mcg by mouth daily.      Marland Kitchen escitalopram (LEXAPRO) 10 MG tablet Take 1 tablet (10 mg total) by mouth daily.  30 tablet  2    No Known Allergies  Review of Systems  Review of Systems  Constitutional: Negative for fever and malaise/fatigue.  HENT: Negative for congestion.   Eyes: Negative for discharge.  Respiratory: Negative for shortness of breath.   Cardiovascular: Negative for chest pain, palpitations and leg swelling.  Gastrointestinal: Negative for nausea, abdominal pain and diarrhea.  Genitourinary: Negative for dysuria.  Musculoskeletal: Negative for falls.  Skin: Negative for rash.  Neurological: Negative for loss of consciousness and headaches.  Endo/Heme/Allergies: Negative for polydipsia.  Psychiatric/Behavioral: Positive for depression. Negative for suicidal ideas. The patient is nervous/anxious and has insomnia.  Objective  BP 120/86  Pulse 81  Temp 98.4 F (36.9 C) (Temporal)  Ht 5\' 4"  (1.626 m)  Wt 224 lb (101.606 kg)  BMI 38.45 kg/m2  SpO2 98%  LMP 07/08/2012  Physical Exam  Physical Exam  Constitutional: She is oriented to person, place, and time and well-developed, well-nourished, and in no distress. No distress.  HENT:  Head: Normocephalic and atraumatic.  Eyes: Conjunctivae normal are normal.  Neck: Neck supple. No thyromegaly present.  Cardiovascular: Normal rate, regular rhythm and normal heart sounds.   No murmur heard. Pulmonary/Chest: Effort normal and breath sounds normal. She has no wheezes.  Abdominal: She exhibits no distension and no mass.    Musculoskeletal: She exhibits no edema.  Lymphadenopathy:    She has no cervical adenopathy.  Neurological: She is alert and oriented to person, place, and time.  Skin: Skin is warm and dry. No rash noted. She is not diaphoretic.  Psychiatric: Memory, affect and judgment normal.    Lab Results  Component Value Date   TSH 0.939 08/10/2011   Lab Results  Component Value Date   WBC 5.1 08/10/2011   HGB 12.0 08/10/2011   HCT 36.3 08/10/2011   MCV 87.1 08/10/2011   PLT 316 08/10/2011   Lab Results  Component Value Date   CREATININE 0.63 08/10/2011   BUN 6 08/10/2011   NA 138 08/10/2011   K 3.9 08/10/2011   CL 105 08/10/2011   CO2 23 08/10/2011   Lab Results  Component Value Date   ALT 10 08/10/2011   AST 14 08/10/2011   ALKPHOS 59 08/10/2011   BILITOT 0.4 08/10/2011   Lab Results  Component Value Date   CHOL 151 08/10/2011   Lab Results  Component Value Date   HDL 49 08/10/2011   Lab Results  Component Value Date   LDLCALC 83 08/10/2011   Lab Results  Component Value Date   TRIG 93 08/10/2011   Lab Results  Component Value Date   CHOLHDL 3.1 08/10/2011     Assessment & Plan  Elevated BP Adequate control  Depression with anxiety Patient still struggling despite short leave from work. Agrees to start Lexapro daily and Alprazolam may be continued prn. She has set up an appt with counselor and we will keep her out of work for the next month.

## 2012-07-12 ENCOUNTER — Ambulatory Visit: Payer: 59 | Admitting: Licensed Clinical Social Worker

## 2012-07-20 ENCOUNTER — Encounter: Payer: Self-pay | Admitting: Family Medicine

## 2012-07-20 ENCOUNTER — Ambulatory Visit (INDEPENDENT_AMBULATORY_CARE_PROVIDER_SITE_OTHER): Payer: 59 | Admitting: Family Medicine

## 2012-07-20 VITALS — BP 130/85 | HR 93 | Temp 98.1°F | Ht 64.0 in | Wt 224.1 lb

## 2012-07-20 DIAGNOSIS — E663 Overweight: Secondary | ICD-10-CM

## 2012-07-20 DIAGNOSIS — R030 Elevated blood-pressure reading, without diagnosis of hypertension: Secondary | ICD-10-CM

## 2012-07-20 DIAGNOSIS — F418 Other specified anxiety disorders: Secondary | ICD-10-CM

## 2012-07-20 DIAGNOSIS — F341 Dysthymic disorder: Secondary | ICD-10-CM

## 2012-07-20 NOTE — Patient Instructions (Addendum)

## 2012-07-24 NOTE — Assessment & Plan Note (Signed)
Has been trying to walk and exercise a bit more while off work but is still struggling with weight.

## 2012-07-24 NOTE — Assessment & Plan Note (Signed)
High normal but somewhat better today while she is away from work

## 2012-07-24 NOTE — Progress Notes (Signed)
Patient ID: Sara Vincent, female   DOB: [birth year], 32 y.o.   MRN: [id number] ANWYN KRIEGEL [id number] August 01, 1980 07/24/2012      Progress Note-Follow Up  Subjective  Chief Complaint  Chief Complaint  Patient presents with  . Follow-up    fill out disability paperwork    HPI  Is a 32 year old [place] American female who is in today for followup. She is presently on medical leave from work secondary to her work environment in increased anxiety and depression as a result. Forthcoming at work she was having complete panic attacks with palpitations and shortness of breath. Tremulousness and difficulty concentrating. Those episodes are decreased but not completely gone. She continues to struggle with concentration and lability. She is tolerating her medications and taking them as prescribed. She's been trying to exercise more and eat right. No chest pain or shortness of breath. No GI or GU complaints noted.  Past Medical History  Diagnosis Date  . Chicken pox as a child  . Anemia   . Overweight 08/10/2011  . Elevated BP 08/10/2011  . Otitis externa of left ear 10/16/2011  . Anxiety 06/29/2012  . Depression with anxiety 06/29/2012    Past Surgical History  Procedure Laterality Date  . Dilation and curettage of uterus  01-2006    Family History  Problem Relation Age of Onset  . Hypertension Mother   . Cancer Father 109    prostate- remission- surgery to remove  . Heart disease Maternal Grandfather   . Hypertension Maternal Grandfather   . Hyperlipidemia Maternal Grandfather   . Stroke Paternal Grandmother   . Cancer Maternal Aunt     breast  . Cancer Paternal Aunt     breast    History   Social History  . Marital Status: Single    Spouse Name: N/A    Number of Children: N/A  . Years of Education: N/A   Occupational History  . Not on file.   Social History Main Topics  . Smoking status: Never Smoker   . Smokeless tobacco: Never Used  . Alcohol Use: Yes     Comment:  occasionally  . Drug Use: No  . Sexually Active: No   Other Topics Concern  . Not on file   Social History Narrative  . No narrative on file    Current Outpatient Prescriptions on File Prior to Visit  Medication Sig Dispense Refill  . ALPRAZolam (XANAX) 0.25 MG tablet Take 1 tablet (0.25 mg total) by mouth 2 (two) times daily as needed for sleep or anxiety.  10 tablet  0  . cetirizine (ZYRTEC) 10 MG tablet Take 1 tablet (10 mg total) by mouth daily.  30 tablet  11  . escitalopram (LEXAPRO) 10 MG tablet Take 1 tablet (10 mg total) by mouth daily.  30 tablet  2  . folic acid (FOLVITE) 1 MG tablet Take 1 mg by mouth daily.      . multivitamin (THERAGRAN) per tablet Take 1 tablet by mouth daily.      . vitamin B-12 (CYANOCOBALAMIN) 500 MCG tablet Take 500 mcg by mouth daily.       No current facility-administered medications on file prior to visit.    No Known Allergies  Review of Systems  Review of Systems  Constitutional: Negative for fever and malaise/fatigue.  HENT: Negative for congestion.   Eyes: Negative for discharge.  Respiratory: Negative for shortness of breath.   Cardiovascular: Positive for palpitations. Negative for chest pain and leg  swelling.  Gastrointestinal: Negative for nausea, abdominal pain and diarrhea.  Genitourinary: Negative for dysuria.  Musculoskeletal: Negative for falls.  Skin: Negative for rash.  Neurological: Negative for loss of consciousness and headaches.  Endo/Heme/Allergies: Negative for polydipsia.  Psychiatric/Behavioral: Positive for depression. Negative for suicidal ideas. The patient is nervous/anxious and has insomnia.     Objective  BP 130/85  Pulse 93  Temp(Src) 98.1 F (36.7 C) (Oral)  Ht 5\' 4"  (1.626 m)  Wt 224 lb 1.9 oz (101.66 kg)  BMI 38.45 kg/m2  SpO2 98%  LMP 07/08/2012  Physical Exam  Physical Exam  Constitutional: She is oriented to person, place, and time and well-developed, well-nourished, and in no distress.  No distress.  HENT:  Head: Normocephalic and atraumatic.  Eyes: Conjunctivae are normal.  Neck: Neck supple. No thyromegaly present.  Cardiovascular: Normal rate, regular rhythm and normal heart sounds.   No murmur heard. Pulmonary/Chest: Effort normal and breath sounds normal. She has no wheezes.  Abdominal: She exhibits no distension and no mass.  Musculoskeletal: She exhibits no edema.  Lymphadenopathy:    She has no cervical adenopathy.  Neurological: She is alert and oriented to person, place, and time.  Skin: Skin is warm and dry. No rash noted. She is not diaphoretic.  Psychiatric: Memory and judgment normal.    Lab Results  Component Value Date   TSH 0.939 08/10/2011   Lab Results  Component Value Date   WBC 5.1 08/10/2011   HGB 12.0 08/10/2011   HCT 36.3 08/10/2011   MCV 87.1 08/10/2011   PLT 316 08/10/2011   Lab Results  Component Value Date   CREATININE 0.63 08/10/2011   BUN 6 08/10/2011   NA 138 08/10/2011   K 3.9 08/10/2011   CL 105 08/10/2011   CO2 23 08/10/2011   Lab Results  Component Value Date   ALT 10 08/10/2011   AST 14 08/10/2011   ALKPHOS 59 08/10/2011   BILITOT 0.4 08/10/2011   Lab Results  Component Value Date   CHOL 151 08/10/2011   Lab Results  Component Value Date   HDL 49 08/10/2011   Lab Results  Component Value Date   LDLCALC 83 08/10/2011   Lab Results  Component Value Date   TRIG 93 08/10/2011   Lab Results  Component Value Date   CHOLHDL 3.1 08/10/2011     Assessment & Plan  Elevated BP High normal but somewhat better today while she is away from work  Overweight Has been trying to walk and exercise a bit more while off work but is still struggling with weight.  Depression with anxiety Has been off work for a short while now and has been somewhat better at home but is still struggling with significant stress and worries about going back to work. Has had a lot of difficulty with the supervisor the last few months and finds herself  having panic attacks and anxiety attacks. Has been taking her medications as prescribed and has not had any difficulty. Has had perhaps mild improvement. Is referred to our behavioral health and is setting up some counselling as well.

## 2012-07-24 NOTE — Assessment & Plan Note (Signed)
Has been off work for a short while now and has been somewhat better at home but is still struggling with significant stress and worries about going back to work. Has had a lot of difficulty with the supervisor the last few months and finds herself having panic attacks and anxiety attacks. Has been taking her medications as prescribed and has not had any difficulty. Has had perhaps mild improvement. Is referred to our behavioral health and is setting up some counselling as well.

## 2012-07-30 ENCOUNTER — Other Ambulatory Visit: Payer: Self-pay

## 2012-08-01 ENCOUNTER — Ambulatory Visit: Payer: 59 | Admitting: Licensed Clinical Social Worker

## 2012-08-08 ENCOUNTER — Ambulatory Visit: Payer: 59 | Admitting: Licensed Clinical Social Worker

## 2012-08-09 ENCOUNTER — Encounter: Payer: Self-pay | Admitting: Family Medicine

## 2012-08-09 ENCOUNTER — Ambulatory Visit: Payer: 59 | Admitting: Licensed Clinical Social Worker

## 2012-08-09 ENCOUNTER — Ambulatory Visit (INDEPENDENT_AMBULATORY_CARE_PROVIDER_SITE_OTHER): Payer: 59 | Admitting: Family Medicine

## 2012-08-09 VITALS — BP 132/84 | HR 93 | Temp 99.1°F | Ht 64.0 in | Wt 223.0 lb

## 2012-08-09 DIAGNOSIS — F411 Generalized anxiety disorder: Secondary | ICD-10-CM

## 2012-08-09 DIAGNOSIS — F419 Anxiety disorder, unspecified: Secondary | ICD-10-CM

## 2012-08-09 DIAGNOSIS — F418 Other specified anxiety disorders: Secondary | ICD-10-CM

## 2012-08-09 DIAGNOSIS — R030 Elevated blood-pressure reading, without diagnosis of hypertension: Secondary | ICD-10-CM

## 2012-08-09 DIAGNOSIS — F341 Dysthymic disorder: Secondary | ICD-10-CM

## 2012-08-09 DIAGNOSIS — L509 Urticaria, unspecified: Secondary | ICD-10-CM

## 2012-08-09 MED ORDER — ALPRAZOLAM 0.25 MG PO TABS: 0.2500 mg | ORAL_TABLET | Freq: Two times a day (BID) | ORAL | Status: DC | PRN

## 2012-08-09 NOTE — Patient Instructions (Addendum)
Try Zyrtec twice daily and Benadryl 25 mg at bed as needed   Hives Hives are itchy, red, swollen areas of the skin. They can vary in size and location on your body. Hives can come and go for hours or several days (acute hives) or for several weeks (chronic hives). Hives do not spread from person to person (noncontagious). They may get worse with scratching, exercise, and emotional stress. CAUSES   Allergic reaction to food, additives, or drugs.  Infections, including the common cold.  Illness, such as vasculitis, lupus, or thyroid disease.  Exposure to sunlight, heat, or cold.  Exercise.  Stress.  Contact with chemicals. SYMPTOMS   Red or white swollen patches on the skin. The patches may change size, shape, and location quickly and repeatedly.  Itching.  Swelling of the hands, feet, and face. This may occur if hives develop deeper in the skin. DIAGNOSIS  Your caregiver can usually tell what is wrong by performing a physical exam. Skin or blood tests may also be done to determine the cause of your hives. In some cases, the cause cannot be determined. TREATMENT  Mild cases usually get better with medicines such as antihistamines. Severe cases may require an emergency epinephrine injection. If the cause of your hives is known, treatment includes avoiding that trigger.  HOME CARE INSTRUCTIONS   Avoid causes that trigger your hives.  Take antihistamines as directed by your caregiver to reduce the severity of your hives. Non-sedating or low-sedating antihistamines are usually recommended. Do not drive while taking an antihistamine.  Take any other medicines prescribed for itching as directed by your caregiver.  Wear loose-fitting clothing.  Keep all follow-up appointments as directed by your caregiver. SEEK MEDICAL CARE IF:   You have persistent or severe itching that is not relieved with medicine.  You have painful or swollen joints. SEEK IMMEDIATE MEDICAL CARE IF:   You  have a fever.  Your tongue or lips are swollen.  You have trouble breathing or swallowing.  You feel tightness in the throat or chest.  You have abdominal pain. These problems may be the first sign of a life-threatening allergic reaction. Call your local emergency services (911 in [place]    Will watch your condition.  Will get help right away if you are not doing well or get worse. Document Released: 06/01/2005 Document Revised: 12/01/2011 Document Reviewed: 08/25/2011 ExitCare Patient Information 2013 ExitCare, [place] SURE YOU:  Understand these instructions.

## 2012-08-14 ENCOUNTER — Encounter: Payer: Self-pay | Admitting: Family Medicine

## 2012-08-14 DIAGNOSIS — L509 Urticaria, unspecified: Secondary | ICD-10-CM | POA: Insufficient documentation

## 2012-08-14 HISTORY — DX: Urticaria, unspecified: L50.9

## 2012-08-14 NOTE — Assessment & Plan Note (Addendum)
Encouraged Zyrtec in am and Benadryl qhs, report worsening symptoms

## 2012-08-14 NOTE — Assessment & Plan Note (Signed)
Improved today, no changes.

## 2012-08-14 NOTE — Progress Notes (Signed)
Patient ID: Sara Vincent, female   DOB: 28-Sep-1980, 32 y.o.   MRN: [id number] Sara Vincent [id number] April 27, 1981 08/14/2012      Progress Note-Follow Up  Subjective  Chief Complaint  Chief Complaint  Patient presents with  . Follow-up    2 week    HPI  Patient is a 32 year old American female who is in today for followup. He does feel that her anxiety and depression are somewhat improved but she's been struggling with hives this past week. Benadryl helps for a short time. She has had fewer panic attacks closed palpitations. Does still feels she is unable to work. She has difficulty concentrating. He is easily agitated and irritated. No recent illness. No chest pain or shortness of breath. No GI or GU complaints  Past Medical History  Diagnosis Date  . Chicken pox as a child  . Anemia   . Overweight 08/10/2011  . Elevated BP 08/10/2011  . Otitis externa of left ear 10/16/2011  . Anxiety 06/29/2012  . Depression with anxiety 06/29/2012  . Urticaria 08/14/2012    Past Surgical History  Procedure Laterality Date  . Dilation and curettage of uterus  01-2006    Family History  Problem Relation Age of Onset  . Hypertension Mother   . Cancer Father 59    prostate- remission- surgery to remove  . Heart disease Maternal Grandfather   . Hypertension Maternal Grandfather   . Hyperlipidemia Maternal Grandfather   . Stroke Paternal Grandmother   . Cancer Maternal Aunt     breast  . Cancer Paternal Aunt     breast    History   Social History  . Marital Status: Single    Spouse Name: N/A    Number of Children: N/A  . Years of Education: N/A   Occupational History  . Not on file.   Social History Main Topics  . Smoking status: Never Smoker   . Smokeless tobacco: Never Used  . Alcohol Use: Yes     Comment: occasionally  . Drug Use: No  . Sexually Active: No   Other Topics Concern  . Not on file   Social History Narrative  . No narrative on file    Current Outpatient  Prescriptions on File Prior to Visit  Medication Sig Dispense Refill  . cetirizine (ZYRTEC) 10 MG tablet Take 1 tablet (10 mg total) by mouth daily.  30 tablet  11  . escitalopram (LEXAPRO) 10 MG tablet Take 1 tablet (10 mg total) by mouth daily.  30 tablet  2  . folic acid (FOLVITE) 1 MG tablet Take 1 mg by mouth daily.      . multivitamin (THERAGRAN) per tablet Take 1 tablet by mouth daily.      . vitamin B-12 (CYANOCOBALAMIN) 500 MCG tablet Take 500 mcg by mouth daily.       No current facility-administered medications on file prior to visit.    No Known Allergies  Review of Systems  Review of Systems  Constitutional: Negative for fever and malaise/fatigue.  HENT: Negative for congestion.   Eyes: Negative for discharge.  Respiratory: Negative for shortness of breath.   Cardiovascular: Positive for palpitations. Negative for chest pain and leg swelling.  Gastrointestinal: Negative for nausea, abdominal pain and diarrhea.  Genitourinary: Negative for dysuria.  Musculoskeletal: Negative for falls.  Skin: Positive for itching and rash.  Neurological: Negative for loss of consciousness and headaches.  Endo/Heme/Allergies: Negative for polydipsia.  Psychiatric/Behavioral: Positive for depression. Negative for suicidal  ideas. The patient is nervous/anxious. The patient does not have insomnia.     Objective  BP 132/84  Pulse 93  Temp(Src) 99.1 F (37.3 C) (Oral)  Ht 5\' 4"  (1.626 m)  Wt 223 lb (101.152 kg)  BMI 38.26 kg/m2  SpO2 99%  LMP 08/05/2012  Physical Exam  Physical Exam  Constitutional: She is oriented to person, place, and time and well-developed, well-nourished, and in no distress. No distress.  HENT:  Head: Normocephalic and atraumatic.  Eyes: Conjunctivae are normal.  Neck: Neck supple. No thyromegaly present.  Cardiovascular: Normal rate, regular rhythm and normal heart sounds.   No murmur heard. Pulmonary/Chest: Effort normal and breath sounds normal. She  has no wheezes.  Abdominal: She exhibits no distension and no mass.  Musculoskeletal: She exhibits no edema.  Lymphadenopathy:    She has no cervical adenopathy.  Neurological: She is alert and oriented to person, place, and time.  Skin: Skin is warm and dry. No rash noted. She is not diaphoretic.  Psychiatric: Memory, affect and judgment normal.    Lab Results  Component Value Date   TSH 0.939 08/10/2011   Lab Results  Component Value Date   WBC 5.1 08/10/2011   HGB 12.0 08/10/2011   HCT 36.3 08/10/2011   MCV 87.1 08/10/2011   PLT 316 08/10/2011   Lab Results  Component Value Date   CREATININE 0.63 08/10/2011   BUN 6 08/10/2011   NA 138 08/10/2011   K 3.9 08/10/2011   CL 105 08/10/2011   CO2 23 08/10/2011   Lab Results  Component Value Date   ALT 10 08/10/2011   AST 14 08/10/2011   ALKPHOS 59 08/10/2011   BILITOT 0.4 08/10/2011   Lab Results  Component Value Date   CHOL 151 08/10/2011   Lab Results  Component Value Date   HDL 49 08/10/2011   Lab Results  Component Value Date   LDLCALC 83 08/10/2011   Lab Results  Component Value Date   TRIG 93 08/10/2011   Lab Results  Component Value Date   CHOLHDL 3.1 08/10/2011     Assessment & Plan  Depression with anxiety Has now developed some urticaria secondary to distress. Does believe the medications are beginning to help somewhat though he is feeling slightly, but still has concentrating continue Lexapro and alprazolam for now. Has established with the counselor and has an appt soon  Elevated BP Improved today, no changes.   Urticaria Encouraged Zyrtec in am and Benadryl qhs, report worsening symptoms

## 2012-08-14 NOTE — Assessment & Plan Note (Signed)
Has now developed some urticaria secondary to distress. Does believe the medications are beginning to help somewhat though he is feeling slightly, but still has concentrating continue Lexapro and alprazolam for now. Has established with the counselor and has an appt soon

## 2012-08-15 ENCOUNTER — Ambulatory Visit (INDEPENDENT_AMBULATORY_CARE_PROVIDER_SITE_OTHER): Payer: 59 | Admitting: Licensed Clinical Social Worker

## 2012-08-15 DIAGNOSIS — F321 Major depressive disorder, single episode, moderate: Secondary | ICD-10-CM

## 2012-08-29 ENCOUNTER — Ambulatory Visit (INDEPENDENT_AMBULATORY_CARE_PROVIDER_SITE_OTHER): Payer: 59 | Admitting: Licensed Clinical Social Worker

## 2012-08-29 DIAGNOSIS — F321 Major depressive disorder, single episode, moderate: Secondary | ICD-10-CM

## 2012-09-01 ENCOUNTER — Encounter: Payer: Self-pay | Admitting: Family Medicine

## 2012-09-01 ENCOUNTER — Ambulatory Visit (INDEPENDENT_AMBULATORY_CARE_PROVIDER_SITE_OTHER): Payer: 59 | Admitting: Family Medicine

## 2012-09-01 ENCOUNTER — Encounter (HOSPITAL_BASED_OUTPATIENT_CLINIC_OR_DEPARTMENT_OTHER): Payer: Self-pay

## 2012-09-01 ENCOUNTER — Ambulatory Visit (HOSPITAL_BASED_OUTPATIENT_CLINIC_OR_DEPARTMENT_OTHER)
Admission: RE | Admit: 2012-09-01 | Discharge: 2012-09-01 | Disposition: A | Discharge status: Home / Self Care | Payer: 59 | Source: Ambulatory Visit | Attending: Family Medicine | Admitting: Family Medicine

## 2012-09-01 VITALS — BP 128/92 | HR 95 | Temp 98.5°F | Ht 64.0 in | Wt 221.1 lb

## 2012-09-01 DIAGNOSIS — R030 Elevated blood-pressure reading, without diagnosis of hypertension: Secondary | ICD-10-CM

## 2012-09-01 DIAGNOSIS — R1900 Intra-abdominal and pelvic swelling, mass and lump, unspecified site: Secondary | ICD-10-CM

## 2012-09-01 DIAGNOSIS — R222 Localized swelling, mass and lump, trunk: Secondary | ICD-10-CM

## 2012-09-01 DIAGNOSIS — F418 Other specified anxiety disorders: Secondary | ICD-10-CM

## 2012-09-01 DIAGNOSIS — J069 Acute upper respiratory infection, unspecified: Secondary | ICD-10-CM

## 2012-09-01 DIAGNOSIS — K439 Ventral hernia without obstruction or gangrene: Secondary | ICD-10-CM | POA: Insufficient documentation

## 2012-09-01 DIAGNOSIS — D259 Leiomyoma of uterus, unspecified: Secondary | ICD-10-CM | POA: Insufficient documentation

## 2012-09-01 DIAGNOSIS — IMO0001 Reserved for inherently not codable concepts without codable children: Secondary | ICD-10-CM

## 2012-09-01 DIAGNOSIS — F341 Dysthymic disorder: Secondary | ICD-10-CM

## 2012-09-01 MED ORDER — HYDROCOD POLST-CHLORPHEN POLST 10-8 MG/5ML PO LQCR: 5.0000 mL | Freq: Every evening | ORAL | Status: DC | PRN

## 2012-09-01 MED ORDER — AZITHROMYCIN 250 MG PO TABS: ORAL_TABLET | ORAL | Status: DC

## 2012-09-01 MED ORDER — IOHEXOL 300 MG/ML  SOLN
100.0000 mL | Freq: Once | INTRAMUSCULAR | Status: AC | PRN
  Administered 2012-09-01: 100 mL via INTRAVENOUS

## 2012-09-01 NOTE — Patient Instructions (Addendum)
Hernia A hernia occurs when an internal organ pushes out through a weak spot in the abdominal wall. Hernias most commonly occur in the groin and around the navel. Hernias often can be pushed back into place (reduced). Most hernias tend to get worse over time. Some abdominal hernias can get stuck in the opening (irreducible or incarcerated hernia) and cannot be reduced. An irreducible abdominal hernia which is tightly squeezed into the opening is at risk for impaired blood supply (strangulated hernia). A strangulated hernia is a medical emergency. Because of the risk for an irreducible or strangulated hernia, surgery may be recommended to repair a hernia. CAUSES   Heavy lifting.  Prolonged coughing.  Straining to have a bowel movement.  A cut (incision) made during an abdominal surgery. HOME CARE INSTRUCTIONS   Bed rest is not required. You may continue your normal activities.  Avoid lifting more than 10 pounds (4.5 kg) or straining.  Cough gently. If you are a smoker it is best to stop. Even the best hernia repair can break down with the continual strain of coughing. Even if you do not have your hernia repaired, a cough will continue to aggravate the problem.  Do not wear anything tight over your hernia. Do not try to keep it in with an outside bandage or truss. These can damage abdominal contents if they are trapped within the hernia sac.  Eat a normal diet.  Avoid constipation. Straining over long periods of time will increase hernia size and encourage breakdown of repairs. If you cannot do this with diet alone, stool softeners may be used. SEEK IMMEDIATE MEDICAL CARE IF:   You have a fever.  You develop increasing abdominal pain.  You feel nauseous or vomit.  Your hernia is stuck outside the abdomen, looks discolored, feels hard, or is tender.  You have any changes in your bowel habits or in the hernia that are unusual for you.  You have increased pain or swelling around the  hernia.  You cannot push the hernia back in place by applying gentle pressure while lying down. MAKE SURE YOU:   Understand these instructions.  Will watch your condition.  Will get help right away if you are not doing well or get worse. Document Released: 06/01/2005 Document Revised: 08/24/2011 Document Reviewed: 01/19/2008 ExitCare Patient Information 2013 [employer].  

## 2012-09-04 ENCOUNTER — Encounter: Payer: Self-pay | Admitting: Family Medicine

## 2012-09-04 DIAGNOSIS — J069 Acute upper respiratory infection, unspecified: Secondary | ICD-10-CM

## 2012-09-04 HISTORY — DX: Acute upper respiratory infection, unspecified: J06.9

## 2012-09-04 NOTE — Assessment & Plan Note (Signed)
Well controlled today, no changes 

## 2012-09-04 NOTE — Assessment & Plan Note (Signed)
Encouraged probiotics, Mucinex, increased rest and hydration. Given a prescription for antibiotics in case symptoms worsen

## 2012-09-04 NOTE — Assessment & Plan Note (Signed)
Improving some on Lexapro. Is not fully stabilized but feels she needs to return to work. Will release to work and she will continue her counseling she has just started.

## 2012-09-04 NOTE — Progress Notes (Signed)
Progress Note-Follow Up Is a 32 year old [place] American female who is in today for followup. She's been off work and while she feels a is improving it is not resolved. She is tolerating Lexapro taking it daily and does feel it is helping some. No GI disturbance. No headaches or chest pains no shortness of breath. She has had a mild cough each bedtime as well as some occasional sputum production clear to green at times. Appears to feel slightly plugged but not painful. Cough is nonproductive. Creation not painful but there is some plugging     Elevated BP Well controlled today, no changes.  Depression with anxiety Improving some on Lexapro. Is not fully stabilized but feels she needs to return to work. Will release to work and she will continue her counseling she has just started.  URI (upper respiratory infection) Encouraged probiotics, Mucinex, increased rest and hydration. Given a prescription for antibiotics in case symptoms worsen   Review of Systems  Review of Systems  Constitutional: Negative for fever and malaise/fatigue.  HENT: Negative for congestion.   Eyes: Negative for discharge.  Respiratory: Negative for shortness of breath.   Cardiovascular: Negative for chest pain, palpitations and leg swelling.  Gastrointestinal: Negative for nausea, abdominal pain and diarrhea.  Genitourinary: Negative for dysuria.  Musculoskeletal: Negative for falls.  Skin: Negative for rash.  Neurological: Negative for loss of consciousness and headaches.  Endo/Heme/Allergies: Negative for polydipsia.  Psychiatric/Behavioral: Negative for depression and suicidal ideas. The patient is not nervous/anxious and does not have insomnia.     Objective  BP 128/92  Pulse 95  Temp(Src) 98.5 F (36.9 C) (Oral)  Ht 5\' 4"  (1.626 m)  Wt 221 lb 1.9 oz (100.299 kg)  BMI 37.94 kg/m2  SpO2 97%  LMP 08/05/2012  Physical Exam   Lab Results  Component Value Date   TSH 0.939 08/10/2011   Lab  Results  Component Value Date   WBC 5.1 08/10/2011   HGB 12.0 08/10/2011   HCT 36.3 08/10/2011   MCV 87.1 08/10/2011   PLT 316 08/10/2011   Lab Results  Component Value Date   CREATININE 0.63 08/10/2011   BUN 6 08/10/2011   NA 138 08/10/2011   K 3.9 08/10/2011   CL 105 08/10/2011   CO2 23 08/10/2011   Lab Results  Component Value Date   ALT 10 08/10/2011   AST 14 08/10/2011   ALKPHOS 59 08/10/2011   BILITOT 0.4 08/10/2011   Lab Results  Component Value Date   CHOL 151 08/10/2011   Lab Results  Component Value Date   HDL 49 08/10/2011   Lab Results  Component Value Date   LDLCALC 83 08/10/2011   Lab Results  Component Value Date   TRIG 93 08/10/2011   Lab Results  Component Value Date   CHOLHDL 3.1 08/10/2011     Assessment & Plan  Elevated BP Well controlled today, no changes.  Depression with anxiety Improving some on Lexapro. Is not fully stabilized but feels she needs to return to work. Will release to work and she will continue her counseling she has just started.  URI (upper respiratory infection) Encouraged probiotics, Mucinex, increased rest and hydration. Given a prescription for antibiotics in case symptoms worsen

## 2012-09-12 ENCOUNTER — Ambulatory Visit (INDEPENDENT_AMBULATORY_CARE_PROVIDER_SITE_OTHER): Payer: 59 | Admitting: Surgery

## 2012-09-12 ENCOUNTER — Encounter (INDEPENDENT_AMBULATORY_CARE_PROVIDER_SITE_OTHER): Payer: Self-pay | Admitting: Surgery

## 2012-09-12 VITALS — BP 138/66 | HR 68 | Temp 97.4°F | Resp 20 | Ht 66.0 in | Wt 221.0 lb

## 2012-09-12 DIAGNOSIS — K436 Other and unspecified ventral hernia with obstruction, without gangrene: Secondary | ICD-10-CM

## 2012-09-12 NOTE — Patient Instructions (Signed)
Hernia, Surgical Repair °A hernia occurs when an internal organ pushes out through a weak spot in the belly (abdominal) wall muscles. Hernias commonly occur in the groin and around the navel. Hernias often can be pushed back into place (reduced). Most hernias tend to get worse over time. Problems occur when abdominal contents get stuck in the opening (incarcerated hernia). The blood supply gets cut off (strangulated hernia). This is an emergency and needs surgery. Otherwise, hernia repair can be an elective procedure. This means you can schedule this at your convenience when an emergency is not present. Because complications can occur, if you decide to repair the hernia, it is best to do it soon. When it becomes an emergency procedure, there is increased risk of complications after surgery. °CAUSES  °· Heavy lifting. °· Obesity. °· Prolonged coughing. °· Straining to move your bowels. °· Hernias can also occur through a cut (incision) by a surgeonafter an abdominal operation. °HOME CARE INSTRUCTIONS °Before the repair: °· Bed rest is not required. You may continue your normal activities, but avoid heavy lifting (more than 10 pounds) or straining. Cough gently. If you are a smoker, it is best to stop. Even the best hernia repair can break down with the continual strain of coughing. °· Do not wear anything tight over your hernia. Do not try to keep it in with an outside bandage or truss. These can damage abdominal contents if they are trapped in the hernia sac. °· Eat a normal diet. Avoid constipation. Straining over long periods of time to have a bowel movement will increase hernia size. It also can breakdown repairs. If you cannot do this with diet alone, laxatives or stool softeners may be used. °PRIOR TO SURGERY, SEEK IMMEDIATE MEDICAL CARE IF: °You have problems (symptoms) of a trapped (incarcerated) hernia. Symptoms include: °· An oral temperature above 102° F (38.9° C) develops, or as your caregiver  suggests. °· Increasing abdominal pain. °· Feeling sick to your stomach(nausea) and vomiting. °· You stop passing gas or stool. °· The hernia is stuck outside the abdomen, looks discolored, feels hard, or is tender. °· You have any changes in your bowel habits or in the hernia that is unusual for you. °LET YOUR CAREGIVERS KNOW ABOUT THE FOLLOWING: °· Allergies. °· Medications taken including herbs, eye drops, over the counter medications, and creams. °· Use of steroids (by mouth or creams). °· Family or personal history of problems with anesthetics or Novocaine. °· Possibility of pregnancy, if this applies. °· Personal history of blood clots (thrombophlebitis). °· Family or personal history of bleeding or blood problems. °· Previous surgery. °· Other health problems. °BEFORE THE PROCEDURE °You should be present 1 hour prior to your procedure, or as directed by your caregiver.  °AFTER THE PROCEDURE °After surgery, you will be taken to the recovery area. A nurse will watch and check your progress there. Once you are awake, stable, and taking fluids well, you will be allowed to go home as long as there are no problems. Once home, an ice pack (wrapped in a light towel) applied to your operative site may help with discomfort. It may also keep the swelling down. Do not lift anything heavier than 10 pounds (4.55 kilograms). Take showers not baths. Do not drive while taking narcotics. Follow instructions as suggested by your caregiver.  °SEEK IMMEDIATE MEDICAL CARE IF: °After surgery: °· There is redness, swelling, or increasing pain in the wound. °· There is pus coming from the wound. °· There is   drainage from a wound lasting longer than 1 day. °· An unexplained oral temperature above 102° F (38.9° C) develops. °· You notice a foul smell coming from the wound or dressing. °· There is a breaking open of a wound (edged not staying together) after the sutures have been removed. °· You notice increasing pain in the shoulders  (shoulder strap areas). °· You develop dizzy episodes or fainting while standing. °· You develop persistent nausea or vomiting. °· You develop a rash. °· You have difficulty breathing. °· You develop any reaction or side effects to medications given. °MAKE SURE YOU:  °· Understand these instructions. °· Will watch your condition. °· Will get help right away if you are not doing well or get worse. °Document Released: 11/25/2000 Document Revised: 08/24/2011 Document Reviewed: 10/18/2007 °Exit[employer]® Patient Information ©2013 [employer]. ° °

## 2012-09-12 NOTE — Progress Notes (Signed)
Patient ID: Sara Vincent, female   DOB: 1980-10-23, 32 y.o.   MRN: [id number]  Chief Complaint  Patient presents with  . New Evaluation    eval abd wall mass ?hernia    HPI Sara Vincent is a 32 y.o. female. Patient sent at the request of Dr. Rogelia Rohrer for painful abdominal wall mass above her umbilicus which is been present for a couple months. She has small hernia there 7 years ago which is been followed but not been painful nor has there been a mass there. Over the last 2 months, the areas become hard and painful. CT scan showed an incarcerated ventral hernia approximately 3 cm in size just left of midline halfway between the umbilicus and xiphoid process. No nausea or vomiting. The area is tender to touch. It is not red or draining. HPI  Past Medical History  Diagnosis Date  . Chicken pox as a child  . Anemia   . Overweight 08/10/2011  . Elevated BP 08/10/2011  . Otitis externa of left ear 10/16/2011  . Anxiety 06/29/2012  . Depression with anxiety 06/29/2012  . Urticaria 08/14/2012  . URI (upper respiratory infection) 09/04/2012    Past Surgical History  Procedure Laterality Date  . Dilation and curettage of uterus  01-2006    Family History  Problem Relation Age of Onset  . Hypertension Mother   . Cancer Father 37    prostate- remission- surgery to remove  . Heart disease Maternal Grandfather   . Hypertension Maternal Grandfather   . Hyperlipidemia Maternal Grandfather   . Stroke Paternal Grandmother   . Cancer Maternal Aunt     breast  . Cancer Paternal Aunt     breast    Social History History  Substance Use Topics  . Smoking status: Never Smoker   . Smokeless tobacco: Never Used  . Alcohol Use: Yes     Comment: occasionally    No Known Allergies  Current Outpatient Prescriptions  Medication Sig Dispense Refill  . ALPRAZolam (XANAX) 0.25 MG tablet Take 1 tablet (0.25 mg total) by mouth 2 (two) times daily as needed for sleep or anxiety.  20 tablet  1  . azithromycin  (ZITHROMAX) 250 MG tablet 2 tabs po once and then 1 tab po daily x 4 days  6 tablet  0  . cetirizine (ZYRTEC) 10 MG tablet Take 1 tablet (10 mg total) by mouth daily.  30 tablet  11  . chlorpheniramine-HYDROcodone (TUSSIONEX PENNKINETIC ER) 10-8 MG/5ML LQCR Take 5 mLs by mouth at bedtime as needed.  140 mL  1  . escitalopram (LEXAPRO) 10 MG tablet Take 1 tablet (10 mg total) by mouth daily.  30 tablet  2  . folic acid (FOLVITE) 1 MG tablet Take 1 mg by mouth daily.      . multivitamin (THERAGRAN) per tablet Take 1 tablet by mouth daily.      . vitamin B-12 (CYANOCOBALAMIN) 500 MCG tablet Take 500 mcg by mouth daily.       No current facility-administered medications for this visit.    Review of Systems Review of Systems  Constitutional: Negative for fever, chills and unexpected weight change.  HENT: Negative for hearing loss, congestion, sore throat, trouble swallowing and voice change.   Eyes: Negative for visual disturbance.  Respiratory: Negative for cough and wheezing.   Cardiovascular: Negative for chest pain, palpitations and leg swelling.  Gastrointestinal: Negative for nausea, vomiting, abdominal pain, diarrhea, constipation, blood in stool, abdominal distention and anal  bleeding.  Genitourinary: Negative for hematuria, vaginal bleeding and difficulty urinating.  Musculoskeletal: Negative for arthralgias.  Skin: Negative for rash and wound.  Neurological: Negative for seizures, syncope and headaches.  Hematological: Negative for adenopathy. Does not bruise/bleed easily.  Psychiatric/Behavioral: Negative for confusion.    Blood pressure 138/66, pulse 68, temperature 97.4 F (36.3 C), temperature source Temporal, resp. rate 20, height 5\' 6"  (1.676 m), weight 221 lb (100.245 kg).  Physical Exam Physical Exam  Constitutional: She is oriented to person, place, and time. She appears well-developed and well-nourished.  HENT:  Head: Normocephalic and atraumatic.  Eyes: EOM are  normal. Pupils are equal, round, and reactive to light.  Neck: Normal range of motion.  Cardiovascular: Normal rate and regular rhythm.   Pulmonary/Chest: Effort normal and breath sounds normal.  Abdominal: She exhibits mass. A hernia is present.    Musculoskeletal: Normal range of motion.  Neurological: She is alert and oriented to person, place, and time.  Skin: Skin is warm and dry.  Psychiatric: She has a normal mood and affect. Her behavior is normal. Judgment and thought content normal.    Data Reviewed *RADIOLOGY REPORT*  Clinical Data: Painful anterior abdominal wall mass. Hernia.  CT ABDOMEN AND PELVIS WITH CONTRAST  Technique: Multidetector CT imaging of the abdomen and pelvis was  performed following the standard protocol during bolus  administration of intravenous contrast.  Contrast: OMNIPAQUE IOHEXOL 300 MG/ML SOLN  Comparison: None.  Findings: The liver, gallbladder, pancreas, spleen, adrenal glands,  and kidneys are normal appearance. No evidence of hydronephrosis.  The 3.5 cm fibroid is seen in the left uterine fundus, however no  adnexal mass identified. No other soft tissue masses or  lymphadenopathy identified within the abdomen or pelvis.  A small epigastric midline ventral abdominal wall hernia is seen  containing only omental fat. This fat shows soft tissue stranding  internally, and may be due to strangulation or incarceration.  There is no evidence of herniated bowel loops or bowel obstruction.  No other inflammatory process or abnormal fluid collections are  identified.  IMPRESSION:  1. Small epigastric ventral hernia containing only omental fat.  The herniated fat shows soft tissue stranding, which may be due to  strangulation or incarceration. No herniated bowel loops or bowel  obstruction.  2. 3.5 cm left uterine fibroid.  Original Report Authenticated By: Myles Rosenthal, M.D.   Assessment    3 cm ventral hernia left of midline halfway  between umbilicus and xiphoid incarcerated with omental fat by CT scan    Plan    Recommend repair of ventral hernia.The risk of hernia repair include bleeding,  Infection,   Recurrence of the hernia,  Mesh use, chronic pain,  Organ injury,  Bowel injury,  Bladder injury,   nerve injury with numbness around the incision,  Death,  and worsening of preexisting  medical problems.  The alternatives to surgery have been discussed as well..  Long term expectations of both operative and non operative treatments have been discussed.   The patient agrees to proceed.       Sara Wollin A. 09/12/2012, 11:34 AM

## 2012-09-15 ENCOUNTER — Encounter: Payer: Self-pay | Admitting: Family Medicine

## 2012-09-15 NOTE — Telephone Encounter (Signed)
Already faxed.

## 2012-09-15 NOTE — Telephone Encounter (Signed)
Please advise 

## 2012-09-20 ENCOUNTER — Ambulatory Visit (INDEPENDENT_AMBULATORY_CARE_PROVIDER_SITE_OTHER): Payer: 59 | Admitting: Licensed Clinical Social Worker

## 2012-09-20 DIAGNOSIS — F321 Major depressive disorder, single episode, moderate: Secondary | ICD-10-CM

## 2012-10-19 ENCOUNTER — Telehealth: Payer: Self-pay | Admitting: Family Medicine

## 2012-10-19 NOTE — Telephone Encounter (Signed)
Call from  Dr Tod Persia      [phone]   ,disability claim  Please call

## 2012-10-20 ENCOUNTER — Ambulatory Visit: Payer: 59 | Admitting: Licensed Clinical Social Worker

## 2012-10-20 NOTE — Telephone Encounter (Signed)
Left a message on Dr Kathrynn Speed voice mail

## 2013-04-20 ENCOUNTER — Other Ambulatory Visit: Payer: Self-pay

## 2013-08-17 ENCOUNTER — Ambulatory Visit (INDEPENDENT_AMBULATORY_CARE_PROVIDER_SITE_OTHER): Payer: 59 | Admitting: Physician Assistant

## 2013-08-17 ENCOUNTER — Encounter: Payer: Self-pay | Admitting: Physician Assistant

## 2013-08-17 VITALS — BP 110/82 | HR 80 | Temp 98.9°F | Resp 16 | Ht 64.0 in | Wt 235.5 lb

## 2013-08-17 DIAGNOSIS — N898 Other specified noninflammatory disorders of vagina: Secondary | ICD-10-CM

## 2013-08-17 DIAGNOSIS — N939 Abnormal uterine and vaginal bleeding, unspecified: Secondary | ICD-10-CM | POA: Insufficient documentation

## 2013-08-17 LAB — CBC WITH DIFFERENTIAL/PLATELET
BASOS ABS: 0 10*3/uL (ref 0.0–0.1)
BASOS PCT: 0 % (ref 0–1)
EOS PCT: 1 % (ref 0–5)
Eosinophils Absolute: 0 10*3/uL (ref 0.0–0.7)
HEMATOCRIT: 35.9 % — AB (ref 36.0–46.0)
HEMOGLOBIN: 11.8 g/dL — AB (ref 12.0–15.0)
Lymphocytes Relative: 41 % (ref 12–46)
Lymphs Abs: 1.7 10*3/uL (ref 0.7–4.0)
MCH: 28.3 pg (ref 26.0–34.0)
MCHC: 32.9 g/dL (ref 30.0–36.0)
MCV: 86.1 fL (ref 78.0–100.0)
MONO ABS: 0.4 10*3/uL (ref 0.1–1.0)
MONOS PCT: 10 % (ref 3–12)
Neutro Abs: 2 10*3/uL (ref 1.7–7.7)
Neutrophils Relative %: 48 % (ref 43–77)
Platelets: 319 10*3/uL (ref 150–400)
RBC: 4.17 MIL/uL (ref 3.87–5.11)
RDW: 13.7 % (ref 11.5–15.5)
WBC: 4.2 10*3/uL (ref 4.0–10.5)

## 2013-08-17 NOTE — Progress Notes (Signed)
Pre visit review using our clinic review tool, if applicable. No additional management support is needed unless otherwise documented below in the visit note/SLS  

## 2013-08-17 NOTE — Progress Notes (Signed)
Patient presents to clinic today c/o vaginal spotting over the past few days. Patient denies abdominal pain, pelvic pain, recent trauma or intercourse. Is currently sexually active with a monogamous partner, without use of protection. Denies intercourse just prior to onset of symptoms.  Denies pareunia, vaginal discharge or pressure.  Denies change in bowel or bladder habits.  Last full period at the end of January.  Denies history of irregular periods.  Endorses some mid bloating and breast tenderness that are typical symptoms right before her period onset. Patient states bleeding is very light.  Uses a panty liner but states it will not be "half saturated with blood" with all-day wear. Is not attempting to get pregnant but endorses taking a pregnancy test yesterday evening that did not yield a clear answer. Patient states the positive line was "possibly there but very faint".  Patient did not repeat home pregnancy test.  Past Medical History  Diagnosis Date  . Chicken pox as a child  . Anemia   . Overweight 08/10/2011  . Elevated BP 08/10/2011  . Otitis externa of left ear 10/16/2011  . Anxiety 06/29/2012  . Depression with anxiety 06/29/2012  . Urticaria 08/14/2012  . URI (upper respiratory infection) 09/04/2012    Current Outpatient Prescriptions on File Prior to Visit  Medication Sig Dispense Refill  . ALPRAZolam (XANAX) 0.25 MG tablet Take 1 tablet (0.25 mg total) by mouth 2 (two) times daily as needed for sleep or anxiety.  20 tablet  1  . azithromycin (ZITHROMAX) 250 MG tablet 2 tabs po once and then 1 tab po daily x 4 days  6 tablet  0  . cetirizine (ZYRTEC) 10 MG tablet Take 1 tablet (10 mg total) by mouth daily.  30 tablet  11  . escitalopram (LEXAPRO) 10 MG tablet Take 1 tablet (10 mg total) by mouth daily.  30 tablet  2  . folic acid (FOLVITE) 1 MG tablet Take 1 mg by mouth daily.      . multivitamin (THERAGRAN) per tablet Take 1 tablet by mouth daily.      . vitamin B-12 (CYANOCOBALAMIN)  500 MCG tablet Take 500 mcg by mouth daily.       No current facility-administered medications on file prior to visit.    No Known Allergies  Family History  Problem Relation Age of Onset  . Hypertension Mother   . Cancer Father 10    prostate- remission- surgery to remove  . Heart disease Maternal Grandfather   . Hypertension Maternal Grandfather   . Hyperlipidemia Maternal Grandfather   . Stroke Paternal Grandmother   . Cancer Maternal Aunt     breast  . Cancer Paternal Aunt     breast    History   Social History  . Marital Status: Single    Spouse Name: N/A    Number of Children: N/A  . Years of Education: N/A   Social History Main Topics  . Smoking status: Never Smoker   . Smokeless tobacco: Never Used  . Alcohol Use: Yes     Comment: occasionally  . Drug Use: No  . Sexual Activity: No   Other Topics Concern  . None   Social History Narrative  . None   Review of Systems - See HPI.  All other ROS are negative.  BP 110/82  Pulse 80  Temp(Src) 98.9 F (37.2 C) (Oral)  Resp 16  Ht 5\' 4"  (1.626 m)  Wt 235 lb 8 oz (106.822 kg)  BMI 40.40  kg/m2  SpO2 98%  LMP 07/01/2013  Physical Exam  Vitals reviewed. Constitutional: She is oriented to person, place, and time and well-developed, well-nourished, and in no distress.  HENT:  Head: Normocephalic and atraumatic.  Eyes: Conjunctivae are normal.  Neck: Neck supple.  Cardiovascular: Normal rate, regular rhythm and normal heart sounds.   Pulmonary/Chest: Effort normal and breath sounds normal.  Abdominal: Soft. Bowel sounds are normal. She exhibits no distension and no mass. There is no tenderness. There is no rebound and no guarding.  Neurological: She is alert and oriented to person, place, and time.  Skin: Skin is warm and dry. No rash noted.  Psychiatric: Affect normal.    No results found for this or any previous visit (from the past 2160 hour(s)).  Assessment/Plan: Vaginal spotting Urine  pregnancy in clinic negative.  Result repeated and verified. Spotting is very light.  Patient is due for her period in the next few days.  Endorses her breast tenderness and bloating are common just before starting her period. Due to questionable + home pregnancy test, will order a serum HCG.  Will also order CBC, Urine G/C.  Patient educated that if workup remarkable and symptoms persist will need further workup including full pelvic examination,imaging and possible referral to specialty.

## 2013-08-17 NOTE — Patient Instructions (Signed)
Please go to lab.  I will call you with your results.  Please monitor symptoms. This is likely due to an oncoming menstrual cycle.  If symptoms persist with no full menstrual period, or if you develop abdominal pain, nausea, then please return to clinic for further evaluation and imaging.

## 2013-08-17 NOTE — Assessment & Plan Note (Addendum)
Likely spotting before onset of full menstrual period. Urine pregnancy in clinic negative.  Result repeated and verified. Spotting is very light.  Patient is due for her period in the next few days.  Endorses her breast tenderness and bloating are common just before starting her period. Due to questionable + home pregnancy test, will order a serum HCG.  Will also order CBC, Urine G/C.  Patient educated that if workup remarkable and symptoms persist will need further workup including full pelvic examination,imaging and possible referral to specialty.

## 2013-08-18 LAB — GC/CHLAMYDIA PROBE AMP, URINE
Chlamydia, Swab/Urine, PCR: NEGATIVE
GC Probe Amp, Urine: NEGATIVE

## 2013-08-18 LAB — HCG, SERUM, QUALITATIVE: Preg, Serum: NEGATIVE

## 2014-04-27 IMAGING — CT CT ABD-PELV W/ CM
2 of 4 series · 16 of 46 positions shown, 18 images · IV contrast (APPLIED)
Comparison: None.

CLINICAL DATA: Painful anterior abdominal wall mass.  Hernia.

CT ABDOMEN AND PELVIS WITH CONTRAST
TECHNIQUE: Multidetector CT imaging of the abdomen and pelvis was
performed following the standard protocol during bolus
administration of intravenous contrast.
Contrast: 100mL OMNIPAQUE IOHEXOL 300 MG/ML  SOLN

[Series 2: abd/pelvis 5.0 b31f · axial · 0.75mm/px · z∈[-496,-71]mm · 13 of 93 slices shown, 15 images]
[im 4/93  soft-tissue]
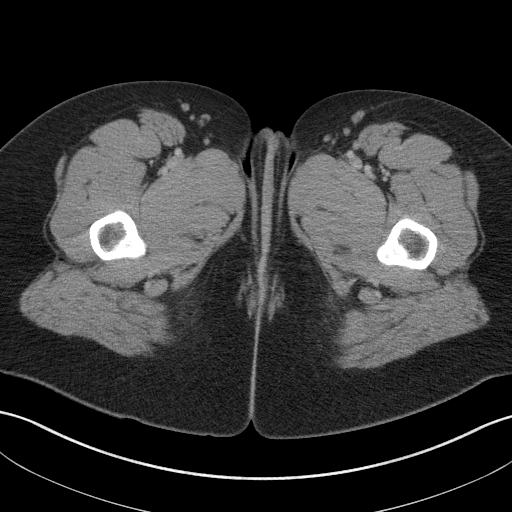
[im 4/93  bone]
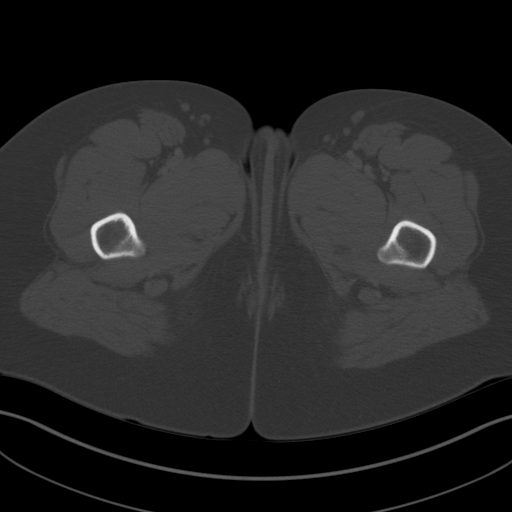
[im 12/93  soft-tissue]
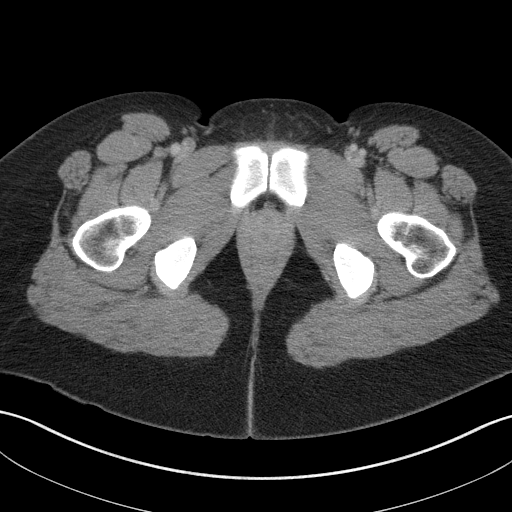
[im 19/93  soft-tissue]
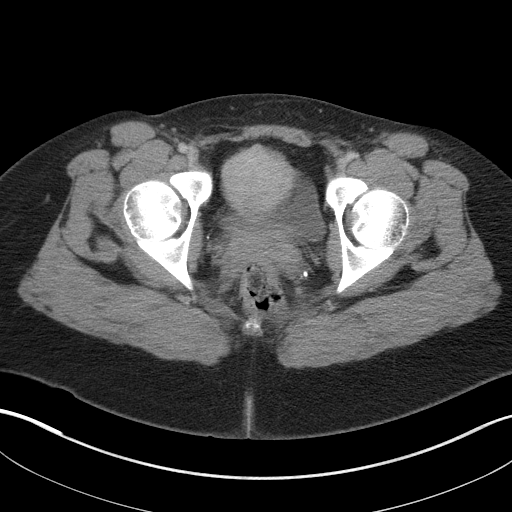
[im 26/93  soft-tissue]
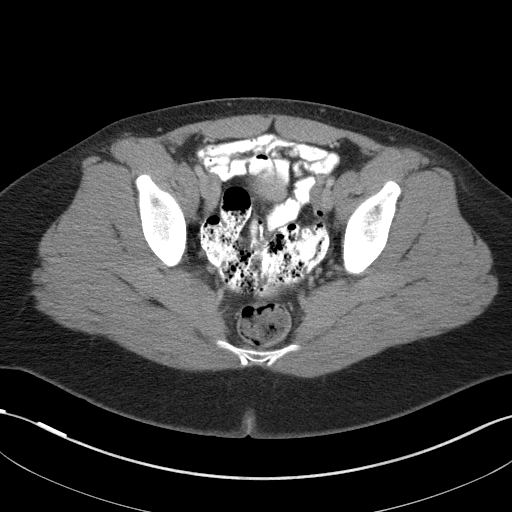
[im 34/93  soft-tissue]
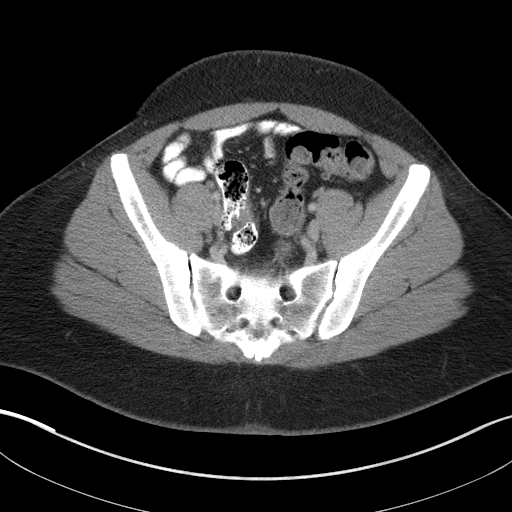
[im 41/93  soft-tissue]
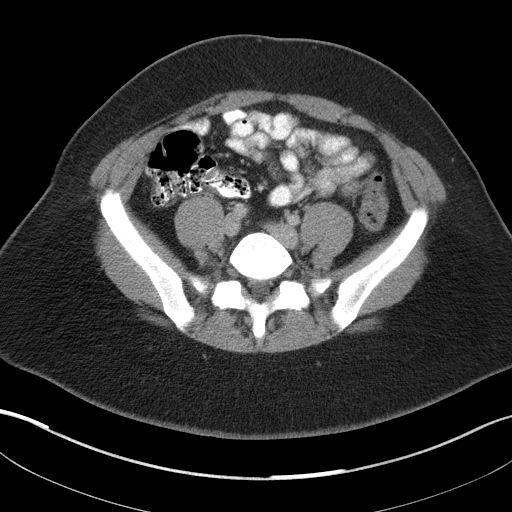
[im 48/93  soft-tissue]
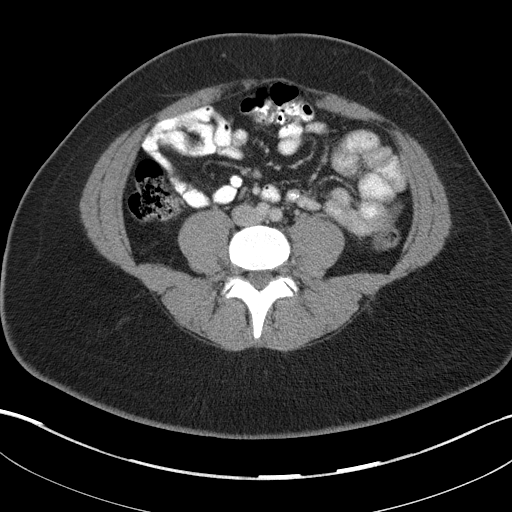
[im 52/93  soft-tissue]
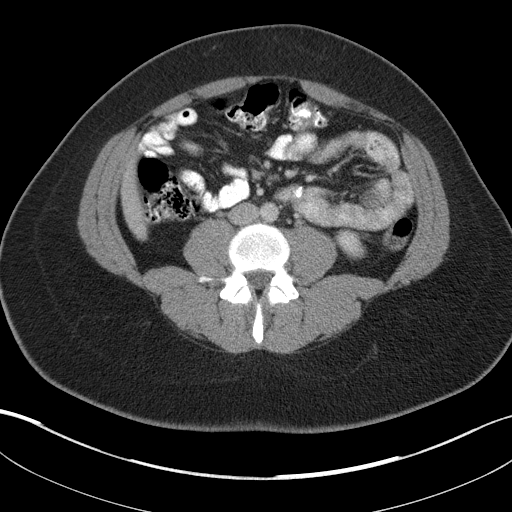
[im 59/93  soft-tissue]
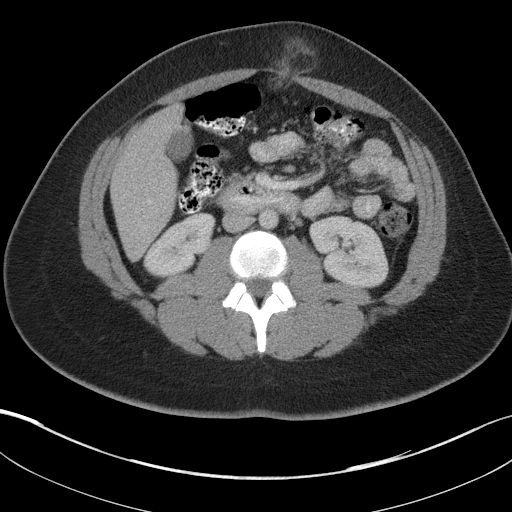
[im 59/93  bone]
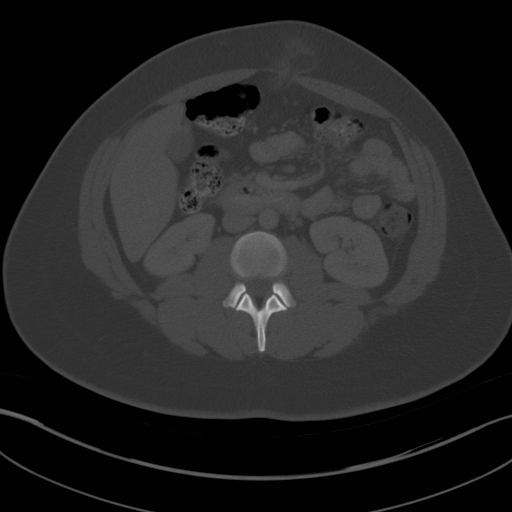
[im 67/93  soft-tissue]
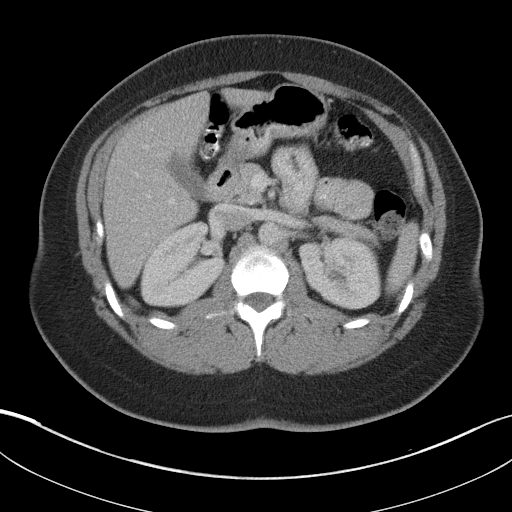
[im 74/93  soft-tissue]
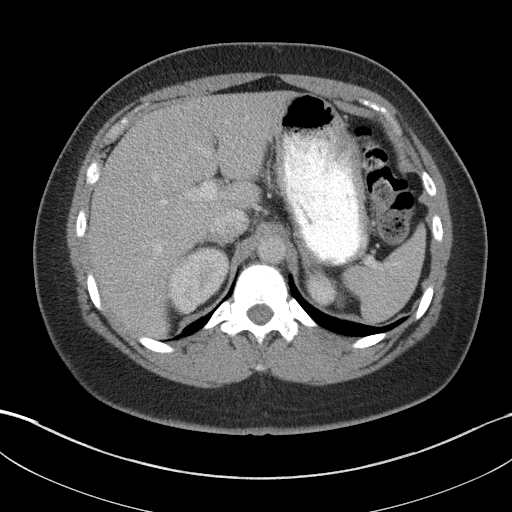
[im 81/93  soft-tissue]
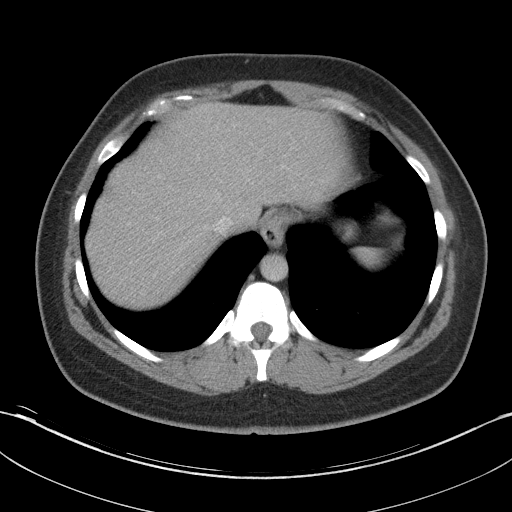
[im 89/93  soft-tissue]
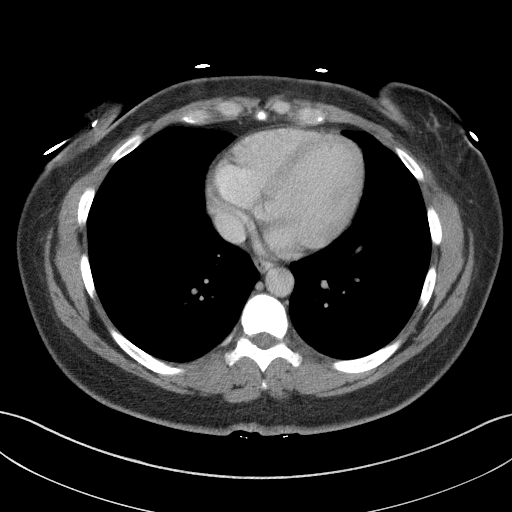

[Series 5: abd/pelvis 3.0 coronal · coronal · 0.66mm/px · 3 of 102 slices shown]
[im 34/102  soft-tissue]
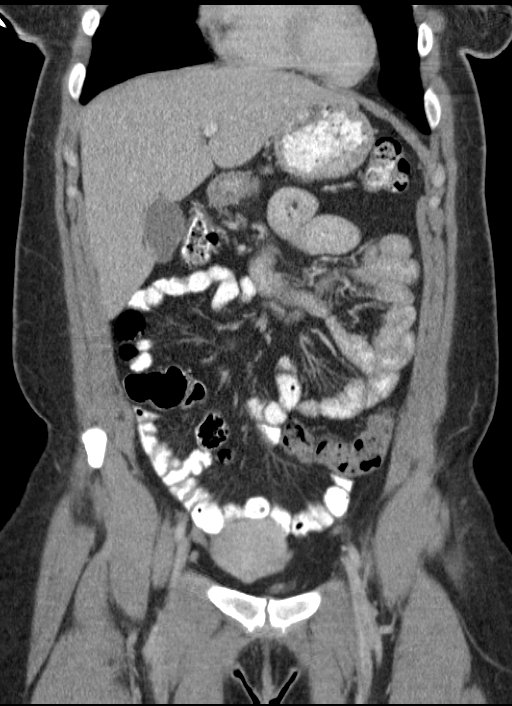
[im 45/102  soft-tissue]
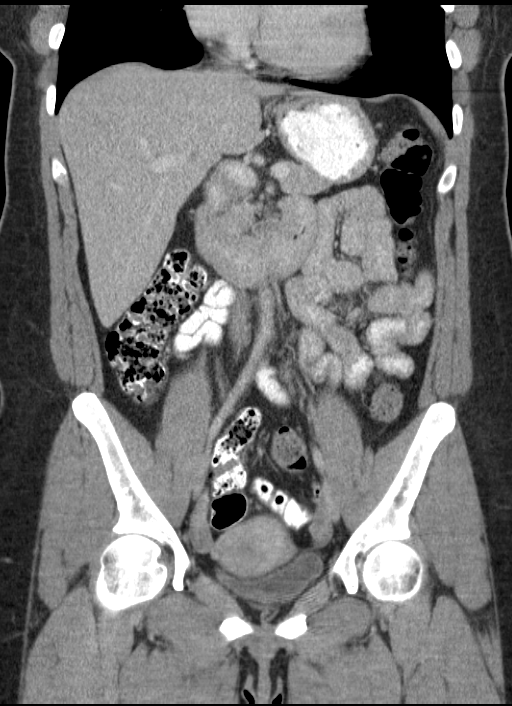
[im 57/102  soft-tissue]
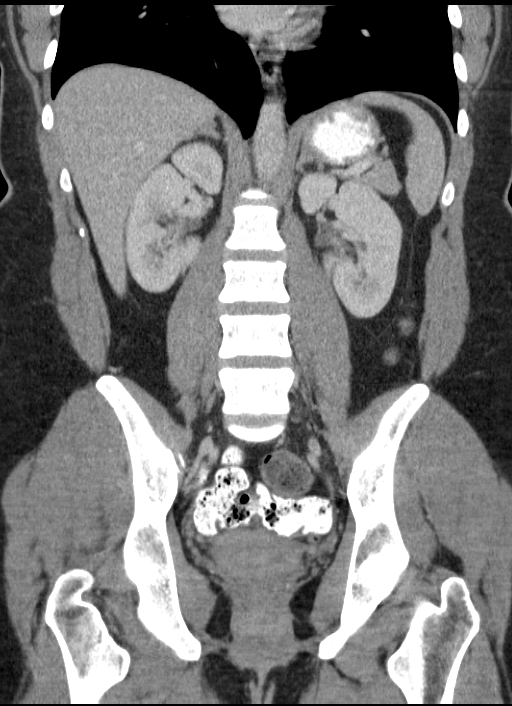

[16 of 46 positions shown; findings below may reference images not displayed]

FINDINGS: The liver, gallbladder, pancreas, spleen, adrenal glands,
and kidneys are normal appearance.  No evidence of hydronephrosis.
The 3.5 cm fibroid is seen in the left uterine fundus, however no
adnexal mass identified.  No other soft tissue masses or
lymphadenopathy identified within the abdomen or pelvis.

A small epigastric midline ventral abdominal wall hernia is seen
containing only omental fat.  This fat shows soft tissue stranding
internally, and may be due to strangulation or incarceration.
There is no evidence of herniated bowel loops or bowel obstruction.
No other inflammatory process or abnormal fluid collections are
identified.
IMPRESSION: 1.  Small epigastric ventral hernia containing only omental fat.
The herniated fat shows soft tissue stranding, which may be due to
strangulation or incarceration.  No herniated bowel loops or bowel
obstruction.
2.  3.5 cm left uterine fibroid.

## 2014-05-30 ENCOUNTER — Ambulatory Visit (HOSPITAL_COMMUNITY)
Admission: RE | Admit: 2014-05-30 | Discharge: 2014-05-30 | Disposition: A | Discharge status: Home / Self Care | Payer: 59 | Source: Ambulatory Visit | Attending: Family Medicine | Admitting: Family Medicine

## 2014-05-30 ENCOUNTER — Other Ambulatory Visit (HOSPITAL_COMMUNITY): Payer: Self-pay | Admitting: Family Medicine

## 2014-05-30 DIAGNOSIS — O26851 Spotting complicating pregnancy, first trimester: Secondary | ICD-10-CM | POA: Insufficient documentation

## 2014-05-30 DIAGNOSIS — D251 Intramural leiomyoma of uterus: Secondary | ICD-10-CM | POA: Diagnosis not present

## 2014-05-30 DIAGNOSIS — O209 Hemorrhage in early pregnancy, unspecified: Secondary | ICD-10-CM

## 2014-05-30 DIAGNOSIS — Z3A01 Less than 8 weeks gestation of pregnancy: Secondary | ICD-10-CM | POA: Insufficient documentation

## 2014-05-30 DIAGNOSIS — O3411 Maternal care for benign tumor of corpus uteri, first trimester: Secondary | ICD-10-CM | POA: Insufficient documentation

## 2014-06-09 ENCOUNTER — Emergency Department (HOSPITAL_COMMUNITY): Payer: Managed Care, Other (non HMO)

## 2014-06-09 ENCOUNTER — Emergency Department (HOSPITAL_COMMUNITY)
Admission: EM | Admit: 2014-06-09 | Discharge: 2014-06-09 | Disposition: A | Discharge status: Home / Self Care | Payer: Managed Care, Other (non HMO) | Attending: Emergency Medicine | Admitting: Emergency Medicine

## 2014-06-09 ENCOUNTER — Encounter (HOSPITAL_COMMUNITY): Payer: Self-pay | Admitting: Emergency Medicine

## 2014-06-09 DIAGNOSIS — E663 Overweight: Secondary | ICD-10-CM | POA: Diagnosis not present

## 2014-06-09 DIAGNOSIS — Z8669 Personal history of other diseases of the nervous system and sense organs: Secondary | ICD-10-CM | POA: Diagnosis not present

## 2014-06-09 DIAGNOSIS — Z8619 Personal history of other infectious and parasitic diseases: Secondary | ICD-10-CM | POA: Insufficient documentation

## 2014-06-09 DIAGNOSIS — Z872 Personal history of diseases of the skin and subcutaneous tissue: Secondary | ICD-10-CM | POA: Insufficient documentation

## 2014-06-09 DIAGNOSIS — Z8709 Personal history of other diseases of the respiratory system: Secondary | ICD-10-CM | POA: Insufficient documentation

## 2014-06-09 DIAGNOSIS — O039 Complete or unspecified spontaneous abortion without complication: Secondary | ICD-10-CM | POA: Diagnosis not present

## 2014-06-09 DIAGNOSIS — Z8659 Personal history of other mental and behavioral disorders: Secondary | ICD-10-CM | POA: Insufficient documentation

## 2014-06-09 DIAGNOSIS — Z3490 Encounter for supervision of normal pregnancy, unspecified, unspecified trimester: Secondary | ICD-10-CM

## 2014-06-09 DIAGNOSIS — Z3A10 10 weeks gestation of pregnancy: Secondary | ICD-10-CM | POA: Diagnosis not present

## 2014-06-09 DIAGNOSIS — N939 Abnormal uterine and vaginal bleeding, unspecified: Secondary | ICD-10-CM

## 2014-06-09 DIAGNOSIS — D649 Anemia, unspecified: Secondary | ICD-10-CM | POA: Insufficient documentation

## 2014-06-09 DIAGNOSIS — O209 Hemorrhage in early pregnancy, unspecified: Secondary | ICD-10-CM | POA: Diagnosis present

## 2014-06-09 LAB — CBC
HCT: 36.1 % (ref 36.0–46.0)
Hemoglobin: 11.8 g/dL — ABNORMAL LOW (ref 12.0–15.0)
MCH: 28.7 pg (ref 26.0–34.0)
MCHC: 32.7 g/dL (ref 30.0–36.0)
MCV: 87.8 fL (ref 78.0–100.0)
PLATELETS: 294 10*3/uL (ref 150–400)
RBC: 4.11 MIL/uL (ref 3.87–5.11)
RDW: 13 % (ref 11.5–15.5)
WBC: 6.1 10*3/uL (ref 4.0–10.5)

## 2014-06-09 LAB — HCG, QUANTITATIVE, PREGNANCY: hCG, Beta Chain, Quant, S: 8035 m[IU]/mL — ABNORMAL HIGH (ref <5)

## 2014-06-09 LAB — TYPE AND SCREEN
ABO/RH(D): O POS
ANTIBODY SCREEN: NEGATIVE

## 2014-06-09 LAB — ABO/RH: ABO/RH(D): O POS

## 2014-06-09 MED ORDER — SODIUM CHLORIDE 0.9 % IV SOLN
INTRAVENOUS | Status: DC
  Administered 2014-06-09: 06:00:00 via INTRAVENOUS

## 2014-06-09 NOTE — ED Notes (Signed)
Pt to [place] with female chaperone at this time

## 2014-06-09 NOTE — ED Notes (Addendum)
G2 P1, [redacted] weeks pregnant, EDD January 04, 2015. Changing pads 2-3 times a day, red/dark red blood, no odor, no pain at this time. Dr. Herbie Baltimore at [facility]

## 2014-06-09 NOTE — ED Notes (Signed)
Patient here with complaint of vaginal bleeding. Patient states that she is [redacted] weeks pregnant. Explains that she initially saw brown spotting for 2 days then yesterday evening bleeding started. Described as dark red with clots present.

## 2014-06-09 NOTE — Discharge Instructions (Signed)
You can take acetaminophen for pain if needed. If you have return of pain, bleeding like a period again, pass clots or feel worse such as feeling light headed or dizzy go to Maternity Admissions at [facility] to be reevaluated. Use contraception for a couple of months then talk to you GYN about trying to get pregnant again.    Miscarriage A miscarriage is the sudden loss of an unborn baby (fetus) before the 20th week of pregnancy. Most miscarriages happen in the first 3 months of pregnancy. Sometimes, it happens before a woman even knows she is pregnant. A miscarriage is also called a "spontaneous miscarriage" or "early pregnancy loss." Having a miscarriage can be an emotional experience. Talk with your caregiver about any questions you may have about miscarrying, the grieving process, and your future pregnancy plans. CAUSES   Problems with the fetal chromosomes that make it impossible for the baby to develop normally. Problems with the baby's genes or chromosomes are most often the result of errors that occur, by chance, as the embryo divides and grows. The problems are not inherited from the parents.  Infection of the cervix or uterus.   Hormone problems.   Problems with the cervix, such as having an incompetent cervix. This is when the tissue in the cervix is not strong enough to hold the pregnancy.   Problems with the uterus, such as an abnormally shaped uterus, uterine fibroids, or congenital abnormalities.   Certain medical conditions.   Smoking, drinking alcohol, or taking illegal drugs.   Trauma.  Often, the cause of a miscarriage is unknown.  SYMPTOMS   Vaginal bleeding or spotting, with or without cramps or pain.  Pain or cramping in the abdomen or lower back.  Passing fluid, tissue, or blood clots from the vagina. DIAGNOSIS  Your caregiver will perform a physical exam. You may also have an ultrasound to confirm the miscarriage. Blood or urine tests may also be  ordered. TREATMENT   Sometimes, treatment is not necessary if you naturally pass all the fetal tissue that was in the uterus. If some of the fetus or placenta remains in the body (incomplete miscarriage), tissue left behind may become infected and must be removed. Usually, a dilation and curettage (D and C) procedure is performed. During a D and C procedure, the cervix is widened (dilated) and any remaining fetal or placental tissue is gently removed from the uterus.  Antibiotic medicines are prescribed if there is an infection. Other medicines may be given to reduce the size of the uterus (contract) if there is a lot of bleeding.  If you have Rh negative blood and your baby was Rh positive, you will need a Rh immunoglobulin shot. This shot will protect any future baby from having Rh blood problems in future pregnancies. HOME CARE INSTRUCTIONS   Your caregiver may order bed rest or may allow you to continue light activity. Resume activity as directed by your caregiver.  Have someone help with home and family responsibilities during this time.   Keep track of the number of sanitary pads you use each day and how soaked (saturated) they are. Write down this information.   Do not use tampons. Do not douche or have sexual intercourse until approved by your caregiver.   Only take over-the-counter or prescription medicines for pain or discomfort as directed by your caregiver.   Do not take aspirin. Aspirin can cause bleeding.   Keep all follow-up appointments with your caregiver.   If you or  your partner have problems with grieving, talk to your caregiver or seek counseling to help cope with the pregnancy loss. Allow enough time to grieve before trying to get pregnant again.  SEEK IMMEDIATE MEDICAL CARE IF:   You have severe cramps or pain in your back or abdomen.  You have a fever.  You pass large blood clots (walnut-sized or larger) ortissue from your vagina. Save any tissue for  your caregiver to inspect.   Your bleeding increases.   You have a thick, bad-smelling vaginal discharge.  You become lightheaded, weak, or you faint.   You have chills.  MAKE SURE YOU:  Understand these instructions.  Will watch your condition.  Will get help right away if you are not doing well or get worse. Document Released: 11/25/2000 Document Revised: 09/26/2012 Document Reviewed: 07/21/2011 [facility] Patient Information 2015 [place], [place]. This information is not intended to replace advice given to you by your health care provider. Make sure you discuss any questions you have with your health care provider.

## 2014-06-09 NOTE — ED Provider Notes (Signed)
CSN: [id number]     Arrival date & time 06/09/14  0250 History   First MD Initiated Contact with Patient 06/09/14 0450     Chief Complaint  Patient presents with  . Vaginal Bleeding  . Pelvic Pain     (Consider location/radiation/quality/duration/timing/severity/associated sxs/prior Treatment) HPI  Patient reports she is G2 P1 Ab0. She states her first pregnancy was complicated by preeclampsia and she was delivered just short of 36 weeks of [place]. She required a D&C for retained products of conception when she continued to have heavy bleeding after delivery. She states that child is now 38 years old. She states she had an ultrasound done December 16 and has not had another ultrasound sound done since. Her ultrasound showed a single intrauterine pregnancy with fetal pole but no fetal heart activity. She had a quantitative beta hCG done on the 16th and again on the 18th at her OB/GYN's office. These results however are not available on Epic. As far she knows they were normal. She reports she started having some spotting on December 24. She states it was initially dark brown. Last night however she started having red bleeding with cramping and around midnight the bleeding got a lot worse with passing of clots. Bleeding is worse than having a period. She did state she did have some dizziness and lightheadedness at home but not now. She denies passing anything gray-white tissue. She reports going to the bathroom when she got to the ED and was still passing clots.   OB[facility]N Dr Mancel Bale [facility]  Past Medical History  Diagnosis Date  . Chicken pox as a child  . Anemia   . Overweight(278.02) 08/10/2011  . Elevated BP 08/10/2011  . Otitis externa of left ear 10/16/2011  . Anxiety 06/29/2012  . Depression with anxiety 06/29/2012  . Urticaria 08/14/2012  . URI (upper respiratory infection) 09/04/2012   Past Surgical History  Procedure Laterality Date  . Dilation and curettage of uterus  01-2006    Family History  Problem Relation Age of Onset  . Hypertension Mother   . Cancer Father 65    prostate- remission- surgery to remove  . Heart disease Maternal Grandfather   . Hypertension Maternal Grandfather   . Hyperlipidemia Maternal Grandfather   . Stroke Paternal Grandmother   . Cancer Maternal Aunt     breast  . Cancer Paternal Aunt     breast   History  Substance Use Topics  . Smoking status: Never Smoker   . Smokeless tobacco: Never Used  . Alcohol Use: Yes     Comment: occasionally   employed  OB History    Gravida Para Term Preterm AB TAB SAB Ectopic Multiple Living   1              Review of Systems  All other systems reviewed and are negative.     Allergies  Review of patient's allergies indicates no known allergies.  Home Medications   PNV Folic Acid BP 790/24 mmHg  Pulse 85  Temp(Src) 98 F (36.7 C) (Oral)  Resp 18  Ht 5\' 7"  (1.702 m)  Wt 235 lb (106.595 kg)  BMI 36.80 kg/m2  SpO2 99%  LMP 03/30/2014 (Approximate)  Vital signs normal   Physical Exam  Constitutional: She is oriented to person, place, and time. She appears well-developed and well-nourished.  Non-toxic appearance. She does not appear ill. No distress.  HENT:  Head: Normocephalic and atraumatic.  Right Ear: External ear normal.  Left Ear: External ear normal.  Nose: Nose normal. No mucosal edema or rhinorrhea.  Mouth/Throat: Mucous membranes are normal. No dental abscesses or uvula swelling.  Eyes: Conjunctivae and EOM are normal. Pupils are equal, round, and reactive to light.  Neck: Normal range of motion and full passive range of motion without pain. Neck supple.  Pulmonary/Chest: Effort normal and breath sounds normal. No respiratory distress. She has no rhonchi. She exhibits no crepitus.  Abdominal: Soft. Normal appearance and bowel sounds are normal. She exhibits no distension. There is tenderness. There is no rebound and no guarding.  Genitourinary:  Patient has  normal external genitalia. There is some blood around her vaginal vault. When I placed a speculum in her vault it is immediately filled with a gray flaccid sac that was collected and sent to the lab for examination. I suspect this is products of conception. On bimanual exam her cervix feels open and easily admits a fingertip.  Musculoskeletal: Normal range of motion. She exhibits no edema or tenderness.  Moves all extremities well.   Neurological: She is alert and oriented to person, place, and time. She has normal strength. No cranial nerve deficit.  Skin: Skin is warm, dry and intact. No rash noted. No erythema. No pallor.  Psychiatric: She has a normal mood and affect. Her speech is normal and behavior is normal. Her mood appears not anxious.  Nursing note and vitals reviewed.   ED Course  Procedures (including critical care time)  We discussed her findings after her exam. My concern was that she had already had a complete abortion. Patient was sent to ultrasound to verify the status of her pregnancy.  Patient's spouse given the results of her ultrasound which was what we had expected. Patient states her abdominal pain is gone. She states her bleeding is gone. Her husband showed me where she used the bedpan to urinate and there was no blood seen. Patient understands that she should wait a few months before she tries to get pregnant again and follow-up of her OB/GYN. She was instructed to go to [facility] to be rechecked if she gets worsening pain, bleeding, or passing clots. Pt understands there was something wrong with the pregnancy and she did not do anything to induce the miscarriage.   Labs Review Results for orders placed or performed during the hospital encounter of 06/09/14  hCG, quantitative, pregnancy(IF PREGNANT)  Result Value Ref Range   hCG, Beta Chain, Quant, S 8035 (H) <5 mIU/mL  CBC (add if pt pregnant)  Result Value Ref Range   WBC 6.1 4.0 - 10.5 K/uL   RBC 4.11 3.87  - 5.11 MIL/uL   Hemoglobin 11.8 (L) 12.0 - 15.0 g/dL   HCT 36.1 36.0 - 46.0 %   MCV 87.8 78.0 - 100.0 fL   MCH 28.7 26.0 - 34.0 pg   MCHC 32.7 30.0 - 36.0 g/dL   RDW 13.0 11.5 - 15.5 %   Platelets 294 150 - 400 K/uL  ABO/Rh (If pregnant)  Result Value Ref Range   ABO/RH(D) O POS    No rh immune globuloin NOT A RH IMMUNE GLOBULIN CANDIDATE, PT RH POSITIVE   Type and screen  Result Value Ref Range   ABO/RH(D) O POS    Antibody Screen NEG    Sample Expiration 06/12/2014    Laboratory interpretation all normal     Imaging Review US Ob Comp Less 14 Wks  US Ob Transvaginal  06/09/2014   CLINICAL DATA:  Acute  onset of vaginal bleeding.  Initial encounter.  EXAM: OBSTETRIC <14 WK [place] AND TRANSVAGINAL OB US  TECHNIQUE: Both transabdominal and transvaginal ultrasound examinations were performed for complete evaluation of the gestation as well as the maternal uterus, adnexal regions, and pelvic cul-de-sac. Transvaginal technique was performed to assess early pregnancy.  COMPARISON:  None.  FINDINGS: Intrauterine gestational sac:  None seen.  Yolk sac:  N/A  Embryo:  N/A  Maternal uterus/adnexae: Two fibroids are noted, both apparently intramural in nature. These measure 3.9 cm and 3.1 cm in size. There is associated displacement of the endometrial canal. Clot is suspected along the cervical canal. There is no definite evidence of retained products of conception.  The ovaries are not visualized on this study. No free fluid is seen within the pelvic cul-de-sac.  IMPRESSION: 1. No intrauterine gestational sac is now seen. Findings are compatible with spontaneous abortion. Apparent clot noted along the cervical canal. 2. Two intramural fibroids noted, measuring 3.9 cm and 3.1 cm in size.   Electronically Signed   By: Garald Balding M.D.   On: 06/09/2014 07:17     CLINICAL DATA: First trimester vaginal bleeding, with spotting starting at 11 a.m. today  EXAM: OBSTETRIC <14 WK [place] AND TRANSVAGINAL  OB US  TECHNIQUE: Both transabdominal and transvaginal ultrasound examinations were performed for complete evaluation of the gestation as well as the maternal uterus, adnexal regions, and pelvic cul-de-sac. Transvaginal technique was performed to assess early pregnancy.  COMPARISON: 09/01/2012 CT scan  FINDINGS: Intrauterine gestational sac: Visualized, single  Yolk sac: Present  Embryo: Present  Cardiac Activity: Absent  MSD: 1 cm, 5 weeks 5 days  CRL: 4 mm 6 w 1 d [place] EDC: 01/23/2015  Maternal uterus/adnexae: Both ovaries unremarkable. No subchorionic hemorrhage or free fluid. Anterior antrum row uterine fibroid 2.6 by 2.3 by 2.1 cm. Posterior intramural fibroid 4.0 by 2.8 by 3.6 cm.  IMPRESSION: 1. Single intrauterine gestational sac with fetal pole visible but no cardiac activity at this time. Mean sac diameter and crown-rump length place the pregnancy at about 6 weeks. The lack of visible cardiac activity is not necessarily abnormal at this size, and followup sonography and correlation with quantitative beta HCG trend is suggested to confirm viability.   Electronically Signed  By: Sherryl Barters M.D.  On: 05/30/2014 17:18       EKG Interpretation None      MDM   Final diagnoses:  Vaginal bleeding  Pregnancy  Complete abortion   Plan discharge  Rolland Porter, MD, Alanson Aly, MD 06/09/14 [phone]

## 2014-06-09 NOTE — ED Notes (Signed)
EDP at bedside. Pt to be discharged home.

## 2014-06-13 ENCOUNTER — Encounter: Payer: Self-pay | Admitting: Family Medicine

## 2014-09-03 ENCOUNTER — Encounter (HOSPITAL_BASED_OUTPATIENT_CLINIC_OR_DEPARTMENT_OTHER): Payer: Self-pay | Admitting: Emergency Medicine

## 2014-09-03 ENCOUNTER — Emergency Department (HOSPITAL_BASED_OUTPATIENT_CLINIC_OR_DEPARTMENT_OTHER)
Admission: EM | Admit: 2014-09-03 | Discharge: 2014-09-03 | Disposition: A | Discharge status: Home / Self Care | Payer: 59 | Attending: Emergency Medicine | Admitting: Emergency Medicine

## 2014-09-03 DIAGNOSIS — Z792 Long term (current) use of antibiotics: Secondary | ICD-10-CM | POA: Insufficient documentation

## 2014-09-03 DIAGNOSIS — Z8619 Personal history of other infectious and parasitic diseases: Secondary | ICD-10-CM | POA: Insufficient documentation

## 2014-09-03 DIAGNOSIS — Z8669 Personal history of other diseases of the nervous system and sense organs: Secondary | ICD-10-CM | POA: Insufficient documentation

## 2014-09-03 DIAGNOSIS — Z79899 Other long term (current) drug therapy: Secondary | ICD-10-CM | POA: Insufficient documentation

## 2014-09-03 DIAGNOSIS — Z872 Personal history of diseases of the skin and subcutaneous tissue: Secondary | ICD-10-CM | POA: Insufficient documentation

## 2014-09-03 DIAGNOSIS — L02511 Cutaneous abscess of right hand: Secondary | ICD-10-CM

## 2014-09-03 DIAGNOSIS — D649 Anemia, unspecified: Secondary | ICD-10-CM | POA: Insufficient documentation

## 2014-09-03 DIAGNOSIS — IMO0001 Reserved for inherently not codable concepts without codable children: Secondary | ICD-10-CM

## 2014-09-03 DIAGNOSIS — F419 Anxiety disorder, unspecified: Secondary | ICD-10-CM | POA: Insufficient documentation

## 2014-09-03 DIAGNOSIS — L03011 Cellulitis of right finger: Secondary | ICD-10-CM | POA: Insufficient documentation

## 2014-09-03 DIAGNOSIS — F329 Major depressive disorder, single episode, unspecified: Secondary | ICD-10-CM | POA: Insufficient documentation

## 2014-09-03 MED ORDER — BUPIVACAINE HCL (PF) 0.5 % IJ SOLN
10.0000 mL | Freq: Once | INTRAMUSCULAR | Status: AC
  Administered 2014-09-03: 10 mL
  Filled 2014-09-03: qty 10

## 2014-09-03 MED ORDER — CLINDAMYCIN HCL 150 MG PO CAPS: 450.0000 mg | ORAL_CAPSULE | Freq: Three times a day (TID) | ORAL | Status: DC

## 2014-09-03 MED ORDER — HYDROCODONE-ACETAMINOPHEN 5-325 MG PO TABS: 1.0000 | ORAL_TABLET | Freq: Four times a day (QID) | ORAL | Status: DC | PRN

## 2014-09-03 NOTE — ED Provider Notes (Signed)
CSN: [id number]     Arrival date & time 09/03/14  2012 History   First MD Initiated Contact with Patient 09/03/14 2132     Chief Complaint  Patient presents with  . Finger Infection      (Consider location/radiation/quality/duration/timing/severity/associated sxs/prior Treatment) HPI Sara Vincent is a 34 year old female who presents the ER complaining of a finger infection. Patient reports she has noticed gradual increase in her symptoms since Saturday, 3 days ago. She reports redness and swelling to her distal ring finger of her R hand. She states she has tried antibiotic ointment and hydroperoxide which has not helped her symptoms. Patient denies numbness, weakness, loss of sensation or function, nausea, vomiting, fever.  Past Medical History  Diagnosis Date  . Chicken pox as a child  . Anemia   . Overweight(278.02) 08/10/2011  . Elevated BP 08/10/2011  . Otitis externa of left ear 10/16/2011  . Anxiety 06/29/2012  . Depression with anxiety 06/29/2012  . Urticaria 08/14/2012  . URI (upper respiratory infection) 09/04/2012   Past Surgical History  Procedure Laterality Date  . Dilation and curettage of uterus  01-2006   Family History  Problem Relation Age of Onset  . Hypertension Mother   . Cancer Father 71    prostate- remission- surgery to remove  . Heart disease Maternal Grandfather   . Hypertension Maternal Grandfather   . Hyperlipidemia Maternal Grandfather   . Stroke Paternal Grandmother   . Cancer Maternal Aunt     breast  . Cancer Paternal Aunt     breast   History  Substance Use Topics  . Smoking status: Never Smoker   . Smokeless tobacco: Never Used  . Alcohol Use: Yes     Comment: occasionally   OB History    Gravida Para Term Preterm AB TAB SAB Ectopic Multiple Living   1              Review of Systems  Constitutional: Negative for fever and chills.  Skin: Positive for rash.      Allergies  Review of patient's allergies indicates no known  allergies.  Home Medications   Prior to Admission medications   Medication Sig Start Date End Date Taking? Authorizing Provider  ALPRAZolam (XANAX) 0.25 MG tablet Take 1 tablet (0.25 mg total) by mouth 2 (two) times daily as needed for sleep or anxiety. 08/09/12   Mosie Lukes, MD  azithromycin (ZITHROMAX) 250 MG tablet 2 tabs po once and then 1 tab po daily x 4 days Patient not taking: Reported on 06/09/2014 09/01/12   Mosie Lukes, MD  cetirizine (ZYRTEC) 10 MG tablet Take 1 tablet (10 mg total) by mouth daily. Patient taking differently: Take 10 mg by mouth daily as needed for allergies.  10/16/11 06/09/14  Mosie Lukes, MD  clindamycin (CLEOCIN) 150 MG capsule Take 3 capsules (450 mg total) by mouth 3 (three) times daily. 09/03/14   Dahlia Bailiff, PA-C  escitalopram (LEXAPRO) 10 MG tablet Take 1 tablet (10 mg total) by mouth daily. Patient not taking: Reported on 06/09/2014 07/11/12   Mosie Lukes, MD  folic acid (FOLVITE) 1 MG tablet Take 1 mg by mouth daily.    Historical Provider, MD  HYDROcodone-acetaminophen (NORCO/VICODIN) 5-325 MG per tablet Take 1-2 tablets by mouth every 6 (six) hours as needed. 09/03/14   Dahlia Bailiff, PA-C  multivitamin [facility]) per tablet Take 1 tablet by mouth daily.    Historical Provider, MD  VITAMIN A PO Take 1 tablet by  mouth daily.    Historical Provider, MD  vitamin B-12 (CYANOCOBALAMIN) 500 MCG tablet Take 500 mcg by mouth daily.    Historical Provider, MD   BP 133/86 mmHg  Pulse 98  Temp(Src) 99.1 F (37.3 C) (Oral)  Resp 18  Ht 5\' 7"  (1.702 m)  Wt 230 lb (104.327 kg)  BMI 36.01 kg/m2  SpO2 99%  LMP 08/14/2014  Breastfeeding? Unknown Physical Exam  Constitutional: She is oriented to person, place, and time. She appears well-developed and well-nourished. No distress.  HENT:  Head: Normocephalic and atraumatic.  Eyes: Right eye exhibits no discharge. Left eye exhibits no discharge. No scleral icterus.  Neck: Normal range of motion.   Pulmonary/Chest: Effort normal. No respiratory distress.  Musculoskeletal: Normal range of motion.  Neurological: She is alert and oriented to person, place, and time.  Skin: Skin is warm and dry. She is not diaphoretic.  R ring finger:  Obvious infection noted to ulnar aspect of cuticle with white colored discharge. Associated mild swelling circumferentially around finger to include mild erythema and mild tenderness to finger pad.  Mild erythema extending to PIP joint. Cap refill <2 sec.  Radial pulse 2+.  Distal sensation intact.    Psychiatric: She has a normal mood and affect.  Nursing note and vitals reviewed.   ED Course  INCISION AND DRAINAGE Date/Time: 09/04/2014 3:19 PM Performed by: Dahlia Bailiff Authorized by: Dahlia Bailiff Consent: Verbal consent obtained. Risks and benefits: risks, benefits and alternatives were discussed Consent given by: patient Patient identity confirmed: verbally with patient Time out: Immediately prior to procedure a "time out" was called to verify the correct patient, procedure, equipment, support staff and site/side marked as required. Type: abscess Body area: upper extremity Location details: right ring finger Anesthesia: digital block Local anesthetic: bupivacaine 0.5% without epinephrine Anesthetic total: 10 ml Patient sedated: no Scalpel size: 11 Incision type: single straight Complexity: simple Drainage: purulent Drainage amount: moderate Wound treatment: wound left open Patient tolerance: Patient tolerated the procedure well with no immediate complications Comments: Paronychia drained R ulnar cuticle.  Felon drained midline distal finger pad.     (including critical care time) Labs Review Labs Reviewed - No data to display  Imaging Review No results found.   EKG Interpretation None      MDM   Final diagnoses:  Felon, right  Paronychia of fourth finger of right hand    Patient with skin abscess amenable to incision and  drainage.  Mild amount of erythema in pad of finger and extending to PIP joint.  Exam most consistent with paronychia, however developing felon a possibility.  Paronychia drained, as well as distal aspect of finger based on concern for developing felon.  Abscess was not large enough to warrant packing or drain,  wound recheck in 2 days, strongly encouraged f/u with Hand Surgery.  Encouraged home warm soaks and flushing.  Mild signs of cellulitis is surrounding skin.  Will d/c to home.  Abx given.  Return precautions discussed.  Pt verbalized understanding and agreement of plan.    BP 133/86 mmHg  Pulse 98  Temp(Src) 99.1 F (37.3 C) (Oral)  Resp 18  Ht 5\' 7"  (1.702 m)  Wt 230 lb (104.327 kg)  BMI 36.01 kg/m2  SpO2 99%  LMP 08/14/2014  Breastfeeding? Unknown  Signed,  Dahlia Bailiff, PA-C 3:25 PM  Patient seen and discussed with Dr. Debby Freiberg, MD  Dahlia Bailiff, PA-C 09/04/14 [place], MD 09/04/14 [phone]

## 2014-09-03 NOTE — Discharge Instructions (Signed)

## 2015-03-22 ENCOUNTER — Encounter: Payer: 59 | Admitting: Family Medicine

## 2015-07-12 ENCOUNTER — Ambulatory Visit: Payer: Self-pay | Admitting: Family Medicine

## 2015-08-01 ENCOUNTER — Encounter: Payer: Self-pay | Admitting: Family Medicine

## 2015-08-01 ENCOUNTER — Other Ambulatory Visit (HOSPITAL_COMMUNITY)
Admission: RE | Admit: 2015-08-01 | Discharge: 2015-08-01 | Disposition: A | Discharge status: Home / Self Care | Payer: 59 | Source: Ambulatory Visit | Attending: Family Medicine | Admitting: Family Medicine

## 2015-08-01 ENCOUNTER — Ambulatory Visit (INDEPENDENT_AMBULATORY_CARE_PROVIDER_SITE_OTHER): Payer: 59 | Admitting: Family Medicine

## 2015-08-01 VITALS — BP 122/82 | HR 97 | Temp 98.3°F | Ht 67.0 in | Wt 240.1 lb

## 2015-08-01 DIAGNOSIS — Z0000 Encounter for general adult medical examination without abnormal findings: Secondary | ICD-10-CM

## 2015-08-01 DIAGNOSIS — Z113 Encounter for screening for infections with a predominantly sexual mode of transmission: Secondary | ICD-10-CM | POA: Diagnosis present

## 2015-08-01 DIAGNOSIS — Z01419 Encounter for gynecological examination (general) (routine) without abnormal findings: Secondary | ICD-10-CM | POA: Insufficient documentation

## 2015-08-01 DIAGNOSIS — Z124 Encounter for screening for malignant neoplasm of cervix: Secondary | ICD-10-CM

## 2015-08-01 DIAGNOSIS — N760 Acute vaginitis: Secondary | ICD-10-CM

## 2015-08-01 DIAGNOSIS — R030 Elevated blood-pressure reading, without diagnosis of hypertension: Secondary | ICD-10-CM

## 2015-08-01 DIAGNOSIS — D649 Anemia, unspecified: Secondary | ICD-10-CM

## 2015-08-01 DIAGNOSIS — IMO0001 Reserved for inherently not codable concepts without codable children: Secondary | ICD-10-CM

## 2015-08-01 DIAGNOSIS — E663 Overweight: Secondary | ICD-10-CM

## 2015-08-01 DIAGNOSIS — Z3169 Encounter for other general counseling and advice on procreation: Secondary | ICD-10-CM | POA: Diagnosis not present

## 2015-08-01 MED ORDER — PRENATAL COMPLETE 14-0.4 MG PO TABS: 1.0000 | ORAL_TABLET | Freq: Every day | ORAL | Status: DC

## 2015-08-01 NOTE — Progress Notes (Signed)
Pre visit review using our clinic review tool, if applicable. No additional management support is needed unless otherwise documented below in the visit note. 

## 2015-08-01 NOTE — Progress Notes (Signed)
HPI        ROS  Physical Exam  .

## 2015-08-01 NOTE — Patient Instructions (Addendum)
[facility] Preventive Care for Adults, Female A healthy lifestyle and preventive care can promote health and wellness. Preventive health guidelines for women include the following key practices.  A routine yearly physical is a good way to check with your health care provider about your health and preventive screening. It is a chance to share any concerns and updates on your health and to receive a thorough exam.  Visit your dentist for a routine exam and preventive care every 6 months. Brush your teeth twice a day and floss once a day. Good oral hygiene prevents tooth decay and gum disease.  The frequency of eye exams is based on your age, health, family medical history, use of contact lenses, and other factors. Follow your health care provider's recommendations for frequency of eye exams.  Eat a healthy diet. Foods like vegetables, fruits, whole grains, low-fat dairy products, and lean protein foods contain the nutrients you need without too many calories. Decrease your intake of foods high in solid fats, added sugars, and salt. Eat the right amount of calories for you.Get information about a proper diet from your health care provider, if necessary.  Regular physical exercise is one of the most important things you can do for your health. Most adults should get at least 150 minutes of moderate-intensity exercise (any activity that increases your heart rate and causes you to sweat) each week. In addition, most adults need muscle-strengthening exercises on 2 or more days a week.  Maintain a healthy weight. The body mass index (BMI) is a screening tool to identify possible weight problems. It provides an estimate of body fat based on height and weight. Your health care provider can find your BMI and can help you achieve or maintain a healthy weight.For adults 20 years and older:  A BMI below 18.5 is considered underweight.  A BMI of 18.5 to 24.9 is normal.  A BMI of 25 to 29.9 is  considered overweight.  A BMI of 30 and above is considered obese.  Maintain normal blood lipids and cholesterol levels by exercising and minimizing your intake of saturated fat. Eat a balanced diet with plenty of fruit and vegetables. Blood tests for lipids and cholesterol should begin at age 60 and be repeated every 5 years. If your lipid or cholesterol levels are high, you are over 50, or you are at high risk for heart disease, you may need your cholesterol levels checked more frequently.Ongoing high lipid and cholesterol levels should be treated with medicines if diet and exercise are not working.  If you smoke, find out from your health care provider how to quit. If you do not use tobacco, do not start.  Lung cancer screening is recommended for adults aged 27-80 years who are at high risk for developing lung cancer because of a history of smoking. A yearly low-dose CT scan of the lungs is recommended for people who have at least a 30-pack-year history of smoking and are a current smoker or have quit within the past 15 years. A pack year of smoking is smoking an average of 1 pack of cigarettes a day for 1 year (for example: 1 pack a day for 30 years or 2 packs a day for 15 years). Yearly screening should continue until the smoker has stopped smoking for at least 15 years. Yearly screening should be stopped for people who develop a health problem that would prevent them from having lung cancer treatment.  If you are pregnant, do not drink alcohol.  If you are breastfeeding, be very cautious about drinking alcohol. If you are not pregnant and choose to drink alcohol, do not have more than 1 drink per day. One drink is considered to be 12 ounces (355 mL) of beer, 5 ounces (148 mL) of wine, or 1.5 ounces (44 mL) of liquor.  Avoid use of street drugs. Do not share needles with anyone. Ask for help if you need support or instructions about stopping the use of drugs.  High blood pressure causes heart  disease and increases the risk of stroke. Your blood pressure should be checked at least every 1 to 2 years. Ongoing high blood pressure should be treated with medicines if weight loss and exercise do not work.  If you are 78-60 years old, ask your health care provider if you should take aspirin to prevent strokes.  Diabetes screening is done by taking a blood sample to check your blood glucose level after you have not eaten for a certain period of time (fasting). If you are not overweight and you do not have risk factors for diabetes, you should be screened once every 3 years starting at age 77. If you are overweight or obese and you are 64-76 years of age, you should be screened for diabetes every year as part of your cardiovascular risk assessment.  Breast cancer screening is essential preventive care for women. You should practice "breast self-awareness." This means understanding the normal appearance and feel of your breasts and may include breast self-examination. Any changes detected, no matter how small, should be reported to a health care provider. Women in their 12s and 30s should have a clinical breast exam (CBE) by a health care provider as part of a regular health exam every 1 to 3 years. After age 17, women should have a CBE every year. Starting at age 78, women should consider having a mammogram (breast X-ray test) every year. Women who have a family history of breast cancer should talk to their health care provider about genetic screening. Women at a high risk of breast cancer should talk to their health care providers about having an MRI and a mammogram every year.  Breast cancer gene (BRCA)-related cancer risk assessment is recommended for women who have family members with BRCA-related cancers. BRCA-related cancers include breast, ovarian, tubal, and peritoneal cancers. Having family members with these cancers may be associated with an increased risk for harmful changes (mutations) in the  breast cancer genes BRCA1 and BRCA2. Results of the assessment will determine the need for genetic counseling and BRCA1 and BRCA2 testing.  Your health care provider may recommend that you be screened regularly for cancer of the pelvic organs (ovaries, uterus, and vagina). This screening involves a pelvic examination, including checking for microscopic changes to the surface of your cervix (Pap test). You may be encouraged to have this screening done every 3 years, beginning at age 20.  For women ages 15-65, health care providers may recommend pelvic exams and Pap testing every 3 years, or they may recommend the Pap and pelvic exam, combined with testing for human papilloma virus (HPV), every 5 years. Some types of HPV increase your risk of cervical cancer. Testing for HPV may also be done on women of any age with unclear Pap test results.  Other health care providers may not recommend any screening for nonpregnant women who are considered low risk for pelvic cancer and who do not have symptoms. Ask your health care provider if a screening pelvic exam  is right for you.  If you have had past treatment for cervical cancer or a condition that could lead to cancer, you need Pap tests and screening for cancer for at least 20 years after your treatment. If Pap tests have been discontinued, your risk factors (such as having a new sexual partner) need to be reassessed to determine if screening should resume. Some women have medical problems that increase the chance of getting cervical cancer. In these cases, your health care provider may recommend more frequent screening and Pap tests.  Colorectal cancer can be detected and often prevented. Most routine colorectal cancer screening begins at the age of 12 years and continues through age 28 years. However, your health care provider may recommend screening at an earlier age if you have risk factors for colon cancer. On a yearly basis, your health care provider may  provide home test kits to check for hidden blood in the stool. Use of a small camera at the end of a tube, to directly examine the colon (sigmoidoscopy or colonoscopy), can detect the earliest forms of colorectal cancer. Talk to your health care provider about this at age 50, when routine screening begins. Direct exam of the colon should be repeated every 5-10 years through age 13 years, unless early forms of precancerous polyps or small growths are found.  People who are at an increased risk for hepatitis B should be screened for this virus. You are considered at high risk for hepatitis B if:  You were born in a country where hepatitis B occurs often. Talk with your health care provider about which countries are considered high risk.  Your parents were born in a high-risk country and you have not received a shot to protect against hepatitis B (hepatitis B vaccine).  You have HIV or AIDS.  You use needles to inject street drugs.  You live with, or have sex with, someone who has hepatitis B.  You get hemodialysis treatment.  You take certain medicines for conditions like cancer, organ transplantation, and autoimmune conditions.  Hepatitis C blood testing is recommended for all people born from [birth year] through 1965 and any individual with known risks for hepatitis C.  Practice safe sex. Use condoms and avoid high-risk sexual practices to reduce the spread of sexually transmitted infections (STIs). STIs include gonorrhea, chlamydia, syphilis, trichomonas, herpes, HPV, and human immunodeficiency virus (HIV). Herpes, HIV, and HPV are viral illnesses that have no cure. They can result in disability, cancer, and death.  You should be screened for sexually transmitted illnesses (STIs) including gonorrhea and chlamydia if:  You are sexually active and are younger than 24 years.  You are older than 24 years and your health care provider tells you that you are at risk for this type of infection.  Your  sexual activity has changed since you were last screened and you are at an increased risk for chlamydia or gonorrhea. Ask your health care provider if you are at risk.  If you are at risk of being infected with HIV, it is recommended that you take a prescription medicine daily to prevent HIV infection. This is called preexposure prophylaxis (PrEP). You are considered at risk if:  You are sexually active and do not regularly use condoms or know the HIV status of your partner(s).  You take drugs by injection.  You are sexually active with a partner who has HIV.  Talk with your health care provider about whether you are at high risk of being infected  with HIV. If you choose to begin PrEP, you should first be tested for HIV. You should then be tested every 3 months for as long as you are taking PrEP.  Osteoporosis is a disease in which the bones lose minerals and strength with aging. This can result in serious bone fractures or breaks. The risk of osteoporosis can be identified using a bone density scan. Women ages 27 years and over and women at risk for fractures or osteoporosis should discuss screening with their health care providers. Ask your health care provider whether you should take a calcium supplement or vitamin D to reduce the rate of osteoporosis.  Menopause can be associated with physical symptoms and risks. Hormone replacement therapy is available to decrease symptoms and risks. You should talk to your health care provider about whether hormone replacement therapy is right for you.  Use sunscreen. Apply sunscreen liberally and repeatedly throughout the day. You should seek shade when your shadow is shorter than you. Protect yourself by wearing long sleeves, pants, a wide-brimmed hat, and sunglasses year round, whenever you are outdoors.  Once a month, do a whole body skin exam, using a mirror to look at the skin on your back. Tell your health care provider of new moles, moles that have  irregular borders, moles that are larger than a pencil eraser, or moles that have changed in shape or color.  Stay current with required vaccines (immunizations).  Influenza vaccine. All adults should be immunized every year.  Tetanus, diphtheria, and acellular pertussis (Td, Tdap) vaccine. Pregnant women should receive 1 dose of Tdap vaccine during each pregnancy. The dose should be obtained regardless of the length of time since the last dose. Immunization is preferred during the 27th-36th week of gestation. An adult who has not previously received Tdap or who does not know her vaccine status should receive 1 dose of Tdap. This initial dose should be followed by tetanus and diphtheria toxoids (Td) booster doses every 10 years. Adults with an unknown or incomplete history of completing a 3-dose immunization series with Td-containing vaccines should begin or complete a primary immunization series including a Tdap dose. Adults should receive a Td booster every 10 years.  Varicella vaccine. An adult without evidence of immunity to varicella should receive 2 doses or a second dose if she has previously received 1 dose. Pregnant females who do not have evidence of immunity should receive the first dose after pregnancy. This first dose should be obtained before leaving the health care facility. The second dose should be obtained 4-8 weeks after the first dose.  Human papillomavirus (HPV) vaccine. Females aged 13-26 years who have not received the vaccine previously should obtain the 3-dose series. The vaccine is not recommended for use in pregnant females. However, pregnancy testing is not needed before receiving a dose. If a female is found to be pregnant after receiving a dose, no treatment is needed. In that case, the remaining doses should be delayed until after the pregnancy. Immunization is recommended for any person with an immunocompromised condition through the age of 79 years if she did not get any or  all doses earlier. During the 3-dose series, the second dose should be obtained 4-8 weeks after the first dose. The third dose should be obtained 24 weeks after the first dose and 16 weeks after the second dose.  Zoster vaccine. One dose is recommended for adults aged 23 years or older unless certain conditions are present.  Measles, mumps, and rubella (MMR)  vaccine. Adults born before 50 generally are considered immune to measles and mumps. Adults born in [birth year] or later should have 1 or more doses of MMR vaccine unless there is a contraindication to the vaccine or there is laboratory evidence of immunity to each of the three diseases. A routine second dose of MMR vaccine should be obtained at least 28 days after the first dose for students attending postsecondary schools, health care workers, or international travelers. People who received inactivated measles vaccine or an unknown type of measles vaccine during 1963-1967 should receive 2 doses of MMR vaccine. People who received inactivated mumps vaccine or an unknown type of mumps vaccine before 1979 and are at high risk for mumps infection should consider immunization with 2 doses of MMR vaccine. For females of childbearing age, rubella immunity should be determined. If there is no evidence of immunity, females who are not pregnant should be vaccinated. If there is no evidence of immunity, females who are pregnant should delay immunization until after pregnancy. Unvaccinated health care workers born before [birth year] who lack laboratory evidence of measles, mumps, or rubella immunity or laboratory confirmation of disease should consider measles and mumps immunization with 2 doses of MMR vaccine or rubella immunization with 1 dose of MMR vaccine.  Pneumococcal 13-valent conjugate (PCV13) vaccine. When indicated, a person who is uncertain of his immunization history and has no record of immunization should receive the PCV13 vaccine. All adults 34 years of age and  older should receive this vaccine. An adult aged 33 years or older who has certain medical conditions and has not been previously immunized should receive 1 dose of PCV13 vaccine. This PCV13 should be followed with a dose of pneumococcal polysaccharide (PPSV23) vaccine. Adults who are at high risk for pneumococcal disease should obtain the PPSV23 vaccine at least 8 weeks after the dose of PCV13 vaccine. Adults older than 34 years of age who have normal immune system function should obtain the PPSV23 vaccine dose at least 1 year after the dose of PCV13 vaccine.  Pneumococcal polysaccharide (PPSV23) vaccine. When PCV13 is also indicated, PCV13 should be obtained first. All adults aged 41 years and older should be immunized. An adult younger than age 39 years who has certain medical conditions should be immunized. Any person who resides in a nursing home or long-term care facility should be immunized. An adult smoker should be immunized. People with an immunocompromised condition and certain other conditions should receive both PCV13 and PPSV23 vaccines. People with human immunodeficiency virus (HIV) infection should be immunized as soon as possible after diagnosis. Immunization during chemotherapy or radiation therapy should be avoided. Routine use of PPSV23 vaccine is not recommended for American Indians, [place] Natives, or people younger than 65 years unless there are medical conditions that require PPSV23 vaccine. When indicated, people who have unknown immunization and have no record of immunization should receive PPSV23 vaccine. One-time revaccination 5 years after the first dose of PPSV23 is recommended for people aged 19-64 years who have chronic kidney failure, nephrotic syndrome, asplenia, or immunocompromised conditions. People who received 1-2 doses of PPSV23 before age 4 years should receive another dose of PPSV23 vaccine at age 63 years or later if at least 5 years have passed since the previous dose.  Doses of PPSV23 are not needed for people immunized with PPSV23 at or after age 57 years.  Meningococcal vaccine. Adults with asplenia or persistent complement component deficiencies should receive 2 doses of quadrivalent meningococcal conjugate (MenACWY-D) vaccine. The doses  should be obtained at least 2 months apart. Microbiologists working with certain meningococcal bacteria, military recruits, people at risk during an outbreak, and people who travel to or live in countries with a high rate of meningitis should be immunized. A first-year college student up through age 66 years who is living in a residence hall should receive a dose if she did not receive a dose on or after her 16th birthday. Adults who have certain high-risk conditions should receive one or more doses of vaccine.  Hepatitis A vaccine. Adults who wish to be protected from this disease, have certain high-risk conditions, work with hepatitis A-infected animals, work in hepatitis A research labs, or travel to or work in countries with a high rate of hepatitis A should be immunized. Adults who were previously unvaccinated and who anticipate close contact with an international adoptee during the first 60 days after arrival in the [place] from a country with a high rate of hepatitis A should be immunized.  Hepatitis B vaccine. Adults who wish to be protected from this disease, have certain high-risk conditions, may be exposed to blood or other infectious body fluids, are household contacts or sex partners of hepatitis B positive people, are clients or workers in certain care facilities, or travel to or work in countries with a high rate of hepatitis B should be immunized.  Haemophilus influenzae type b (Hib) vaccine. A previously unvaccinated person with asplenia or sickle cell disease or having a scheduled splenectomy should receive 1 dose of Hib vaccine. Regardless of previous immunization, a recipient of a hematopoietic stem cell  transplant should receive a 3-dose series 6-12 months after her successful transplant. Hib vaccine is not recommended for adults with HIV infection. Preventive Services / Frequency Ages 24 to 39 years  Blood pressure check.** / Every 3-5 years.  Lipid and cholesterol check.** / Every 5 years beginning at age 75.  Clinical breast exam.** / Every 3 years for women in their 37s and 30s.  BRCA-related cancer risk assessment.** / For women who have family members with a BRCA-related cancer (breast, ovarian, tubal, or peritoneal cancers).  Pap test.** / Every 2 years from ages 63 through 31. Every 3 years starting at age 86 through age 75 or 66 with a history of 3 consecutive normal Pap tests.  HPV screening.** / Every 3 years from ages 95 through ages 43 to 66 with a history of 3 consecutive normal Pap tests.  Hepatitis C blood test.** / For any individual with known risks for hepatitis C.  Skin self-exam. / Monthly.  Influenza vaccine. / Every year.  Tetanus, diphtheria, and acellular pertussis (Tdap, Td) vaccine.** / Consult your health care provider. Pregnant women should receive 1 dose of Tdap vaccine during each pregnancy. 1 dose of Td every 10 years.  Varicella vaccine.** / Consult your health care provider. Pregnant females who do not have evidence of immunity should receive the first dose after pregnancy.  HPV vaccine. / 3 doses over 6 months, if 26 and younger. The vaccine is not recommended for use in pregnant females. However, pregnancy testing is not needed before receiving a dose.  Measles, mumps, rubella (MMR) vaccine.** / You need at least 1 dose of MMR if you were born in 1957 or later. You may also need a 2nd dose. For females of childbearing age, rubella immunity should be determined. If there is no evidence of immunity, females who are not pregnant should be vaccinated. If there is no evidence of immunity,  females who are pregnant should delay immunization until after  pregnancy.  Pneumococcal 13-valent conjugate (PCV13) vaccine.** / Consult your health care provider.  Pneumococcal polysaccharide (PPSV23) vaccine.** / 1 to 2 doses if you smoke cigarettes or if you have certain conditions.  Meningococcal vaccine.** / 1 dose if you are age 10 to 58 years and a Market researcher living in a residence hall, or have one of several medical conditions, you need to get vaccinated against meningococcal disease. You may also need additional booster doses.  Hepatitis A vaccine.** / Consult your health care provider.  Hepatitis B vaccine.** / Consult your health care provider.  Haemophilus influenzae type b (Hib) vaccine.** / Consult your health care provider. Ages 35 to 28 years  Blood pressure check.** / Every year.  Lipid and cholesterol check.** / Every 5 years beginning at age 1 years.  Lung cancer screening. / Every year if you are aged 56-80 years and have a 30-pack-year history of smoking and currently smoke or have quit within the past 15 years. Yearly screening is stopped once you have quit smoking for at least 15 years or develop a health problem that would prevent you from having lung cancer treatment.  Clinical breast exam.** / Every year after age 53 years.  BRCA-related cancer risk assessment.** / For women who have family members with a BRCA-related cancer (breast, ovarian, tubal, or peritoneal cancers).  Mammogram.** / Every year beginning at age 63 years and continuing for as long as you are in good health. Consult with your health care provider.  Pap test.** / Every 3 years starting at age 80 years through age 59 or 72 years with a history of 3 consecutive normal Pap tests.  HPV screening.** / Every 3 years from ages 65 years through ages 51 to 59 years with a history of 3 consecutive normal Pap tests.  Fecal occult blood test (FOBT) of stool. / Every year beginning at age 59 years and continuing until age 48 years. You may not need  to do this test if you get a colonoscopy every 10 years.  Flexible sigmoidoscopy or colonoscopy.** / Every 5 years for a flexible sigmoidoscopy or every 10 years for a colonoscopy beginning at age 40 years and continuing until age 51 years.  Hepatitis C blood test.** / For all people born from [birth year] through 1964 and any individual with known risks for hepatitis C.  Skin self-exam. / Monthly.  Influenza vaccine. / Every year.  Tetanus, diphtheria, and acellular pertussis (Tdap/Td) vaccine.** / Consult your health care provider. Pregnant women should receive 1 dose of Tdap vaccine during each pregnancy. 1 dose of Td every 10 years.  Varicella vaccine.** / Consult your health care provider. Pregnant females who do not have evidence of immunity should receive the first dose after pregnancy.  Zoster vaccine.** / 1 dose for adults aged 53 years or older.  Measles, mumps, rubella (MMR) vaccine.** / You need at least 1 dose of MMR if you were born in 1957 or later. You may also need a second dose. For females of childbearing age, rubella immunity should be determined. If there is no evidence of immunity, females who are not pregnant should be vaccinated. If there is no evidence of immunity, females who are pregnant should delay immunization until after pregnancy.  Pneumococcal 13-valent conjugate (PCV13) vaccine.** / Consult your health care provider.  Pneumococcal polysaccharide (PPSV23) vaccine.** / 1 to 2 doses if you smoke cigarettes or if you have certain conditions.  Meningococcal vaccine.** / Consult your health care provider.  Hepatitis A vaccine.** / Consult your health care provider.  Hepatitis B vaccine.** / Consult your health care provider.  Haemophilus influenzae type b (Hib) vaccine.** / Consult your health care provider. Ages 54 years and over  Blood pressure check.** / Every year.  Lipid and cholesterol check.** / Every 5 years beginning at age 87 years.  Lung cancer  screening. / Every year if you are aged 71-80 years and have a 30-pack-year history of smoking and currently smoke or have quit within the past 15 years. Yearly screening is stopped once you have quit smoking for at least 15 years or develop a health problem that would prevent you from having lung cancer treatment.  Clinical breast exam.** / Every year after age 68 years.  BRCA-related cancer risk assessment.** / For women who have family members with a BRCA-related cancer (breast, ovarian, tubal, or peritoneal cancers).  Mammogram.** / Every year beginning at age 93 years and continuing for as long as you are in good health. Consult with your health care provider.  Pap test.** / Every 3 years starting at age 76 years through age 45 or 58 years with 3 consecutive normal Pap tests. Testing can be stopped between 65 and 70 years with 3 consecutive normal Pap tests and no abnormal Pap or HPV tests in the past 10 years.  HPV screening.** / Every 3 years from ages 37 years through ages 35 or 59 years with a history of 3 consecutive normal Pap tests. Testing can be stopped between 65 and 70 years with 3 consecutive normal Pap tests and no abnormal Pap or HPV tests in the past 10 years.  Fecal occult blood test (FOBT) of stool. / Every year beginning at age 5 years and continuing until age 66 years. You may not need to do this test if you get a colonoscopy every 10 years.  Flexible sigmoidoscopy or colonoscopy.** / Every 5 years for a flexible sigmoidoscopy or every 10 years for a colonoscopy beginning at age 49 years and continuing until age 81 years.  Hepatitis C blood test.** / For all people born from [birth year] through 1964 and any individual with known risks for hepatitis C.  Osteoporosis screening.** / A one-time screening for women ages 28 years and over and women at risk for fractures or osteoporosis.  Skin self-exam. / Monthly.  Influenza vaccine. / Every year.  Tetanus, diphtheria, and  acellular pertussis (Tdap/Td) vaccine.** / 1 dose of Td every 10 years.  Varicella vaccine.** / Consult your health care provider.  Zoster vaccine.** / 1 dose for adults aged 36 years or older.  Pneumococcal 13-valent conjugate (PCV13) vaccine.** / Consult your health care provider.  Pneumococcal polysaccharide (PPSV23) vaccine.** / 1 dose for all adults aged 39 years and older.  Meningococcal vaccine.** / Consult your health care provider.  Hepatitis A vaccine.** / Consult your health care provider.  Hepatitis B vaccine.** / Consult your health care provider.  Haemophilus influenzae type b (Hib) vaccine.** / Consult your health care provider. ** Family history and personal history of risk and conditions may change your health care provider's recommendations.   This information is not intended to replace advice given to you by your health care provider. Make sure you discuss any questions you have with your health care provider.   Document Released: 07/28/2001 Document Revised: 06/22/2014 Document Reviewed: 10/27/2010 Elsevier Interactive Patient Education [employer].

## 2015-08-02 ENCOUNTER — Encounter: Payer: Self-pay | Admitting: Family Medicine

## 2015-08-05 LAB — CERVICOVAGINAL ANCILLARY ONLY
Chlamydia: NEGATIVE
NEISSERIA GONORRHEA: NEGATIVE
Trichomonas: NEGATIVE

## 2015-08-06 LAB — CYTOLOGY - PAP

## 2015-08-07 LAB — CERVICOVAGINAL ANCILLARY ONLY
Bacterial vaginitis: NEGATIVE
Candida vaginitis: NEGATIVE

## 2015-08-09 ENCOUNTER — Other Ambulatory Visit: Payer: Self-pay | Admitting: Family Medicine

## 2015-08-09 ENCOUNTER — Encounter: Payer: Self-pay | Admitting: Family Medicine

## 2015-08-09 MED ORDER — PRENATAL COMPLETE 14-0.4 MG PO TABS: 1.0000 | ORAL_TABLET | Freq: Every day | ORAL | Status: DC

## 2015-08-11 ENCOUNTER — Encounter: Payer: Self-pay | Admitting: Family Medicine

## 2015-08-11 NOTE — Progress Notes (Signed)
Patient ID: Sara Vincent, female   DOB: September 26, 1980, 35 y.o.   MRN: [id number]   Subjective:    Patient ID: Sara Vincent, female    DOB: 05/27/1981, 35 y.o.   MRN: [id number]  Chief Complaint  Patient presents with  . Follow-up    HPI Patient is in today for follow-up and Pap smear. She does note some scant vaginal discharge but no lesions, change in partners, pain or significant pruritus. No fevers, chills, abdominal pain. She reports ongoing stress secondary to work and home but feels she is managing well with Lexapro. No recent illness. No other acute complaints. Denies CP/palp/SOB/HA/congestion/fevers/GI or GU c/o. Taking meds as prescribed  Past Medical History  Diagnosis Date  . Chicken pox as a child  . Anemia   . Overweight(278.02) 08/10/2011  . Elevated BP 08/10/2011  . Otitis externa of left ear 10/16/2011  . Anxiety 06/29/2012  . Depression with anxiety 06/29/2012  . Urticaria 08/14/2012  . URI (upper respiratory infection) 09/04/2012    Past Surgical History  Procedure Laterality Date  . Dilation and curettage of uterus  01-2006    Family History  Problem Relation Age of Onset  . Hypertension Mother   . Cancer Father 8    prostate- remission- surgery to remove  . Heart disease Maternal Grandfather   . Hypertension Maternal Grandfather   . Hyperlipidemia Maternal Grandfather   . Stroke Paternal Grandmother   . Cancer Maternal Aunt     breast  . Cancer Paternal Aunt     breast    Social History   Social History  . Marital Status: Single    Spouse Name: N/A  . Number of Children: N/A  . Years of Education: N/A   Occupational History  . Not on file.   Social History Main Topics  . Smoking status: Never Smoker   . Smokeless tobacco: Never Used  . Alcohol Use: Yes     Comment: occasionally  . Drug Use: No  . Sexual Activity: No   Other Topics Concern  . Not on file   Social History Narrative    Outpatient Prescriptions Prior to Visit  Medication Sig  Dispense Refill  . folic acid (FOLVITE) 1 MG tablet Take 1 mg by mouth daily.    . multivitamin (THERAGRAN) per tablet Take 1 tablet by mouth daily.    Marland Kitchen VITAMIN A PO Take 1 tablet by mouth daily.    . vitamin B-12 (CYANOCOBALAMIN) 500 MCG tablet Take 500 mcg by mouth daily.    . cetirizine (ZYRTEC) 10 MG tablet Take 1 tablet (10 mg total) by mouth daily. (Patient not taking: Reported on 08/01/2015) 30 tablet 11  . ALPRAZolam (XANAX) 0.25 MG tablet Take 1 tablet (0.25 mg total) by mouth 2 (two) times daily as needed for sleep or anxiety. 20 tablet 1  . azithromycin (ZITHROMAX) 250 MG tablet 2 tabs po once and then 1 tab po daily x 4 days (Patient not taking: Reported on 06/09/2014) 6 tablet 0  . clindamycin (CLEOCIN) 150 MG capsule Take 3 capsules (450 mg total) by mouth 3 (three) times daily. 90 capsule 0  . escitalopram (LEXAPRO) 10 MG tablet Take 1 tablet (10 mg total) by mouth daily. (Patient not taking: Reported on 06/09/2014) 30 tablet 2  . HYDROcodone-acetaminophen (NORCO/VICODIN) 5-325 MG per tablet Take 1-2 tablets by mouth every 6 (six) hours as needed. 15 tablet 0   No facility-administered medications prior to visit.    No Known Allergies  Review of Systems  Constitutional: Negative for fever and malaise/fatigue.  HENT: Negative for congestion.   Eyes: Negative for discharge.  Respiratory: Negative for shortness of breath.   Cardiovascular: Negative for chest pain, palpitations and leg swelling.  Gastrointestinal: Negative for nausea and abdominal pain.  Genitourinary: Negative for dysuria.  Musculoskeletal: Negative for falls.  Skin: Negative for rash.  Neurological: Negative for loss of consciousness and headaches.  Endo/Heme/Allergies: Negative for environmental allergies.  Psychiatric/Behavioral: Negative for depression. The patient is not nervous/anxious.        Objective:    Physical Exam  Constitutional: She is oriented to person, place, and time. She appears  well-developed and well-nourished. No distress.  HENT:  Head: Normocephalic and atraumatic.  Nose: Nose normal.  Eyes: Right eye exhibits no discharge. Left eye exhibits no discharge.  Neck: Normal range of motion. Neck supple.  Cardiovascular: Normal rate and regular rhythm.   No murmur heard. Pulmonary/Chest: Effort normal and breath sounds normal.  Abdominal: Soft. Bowel sounds are normal. There is no tenderness.  Musculoskeletal: She exhibits no edema.  Neurological: She is alert and oriented to person, place, and time.  Skin: Skin is warm and dry.  Psychiatric: She has a normal mood and affect.  Nursing note and vitals reviewed.   BP 122/82 mmHg  Pulse 97  Temp(Src) 98.3 F (36.8 C) (Oral)  Ht 5\' 7"  (1.702 m)  Wt 240 lb 2 oz (108.92 kg)  BMI 37.60 kg/m2  SpO2 98%  LMP 07/19/2015  Breastfeeding? No Wt Readings from Last 3 Encounters:  08/01/15 240 lb 2 oz (108.92 kg)  09/03/14 230 lb (104.327 kg)  06/09/14 235 lb (106.595 kg)     Lab Results  Component Value Date   WBC 6.1 06/09/2014   HGB 11.8* 06/09/2014   HCT 36.1 06/09/2014   PLT 294 06/09/2014   GLUCOSE 88 08/10/2011   CHOL 151 08/10/2011   TRIG 93 08/10/2011   HDL 49 08/10/2011   LDLCALC 83 08/10/2011   ALT 10 08/10/2011   AST 14 08/10/2011   NA 138 08/10/2011   K 3.9 08/10/2011   CL 105 08/10/2011   CREATININE 0.63 08/10/2011   BUN 6 08/10/2011   CO2 23 08/10/2011   TSH 0.939 08/10/2011    Lab Results  Component Value Date   TSH 0.939 08/10/2011   Lab Results  Component Value Date   WBC 6.1 06/09/2014   HGB 11.8* 06/09/2014   HCT 36.1 06/09/2014   MCV 87.8 06/09/2014   PLT 294 06/09/2014   Lab Results  Component Value Date   NA 138 08/10/2011   K 3.9 08/10/2011   CO2 23 08/10/2011   GLUCOSE 88 08/10/2011   BUN 6 08/10/2011   CREATININE 0.63 08/10/2011   BILITOT 0.4 08/10/2011   ALKPHOS 59 08/10/2011   AST 14 08/10/2011   ALT 10 08/10/2011   PROT 7.9 08/10/2011   ALBUMIN 4.2  08/10/2011   CALCIUM 9.3 08/10/2011   Lab Results  Component Value Date   CHOL 151 08/10/2011   Lab Results  Component Value Date   HDL 49 08/10/2011   Lab Results  Component Value Date   LDLCALC 27-Aug-1980 08/10/2011   Lab Results  Component Value Date   TRIG 93 08/10/2011   Lab Results  Component Value Date   CHOLHDL 3.1 08/10/2011   No results found for: HGBA1C     Assessment & Plan:   Problem List Items Addressed This Visit    Anemia  Relevant Orders   CBC   Lipid panel   Comprehensive metabolic panel   TSH   Cervical cancer screening    Pap today, no concerns on exam. Vaginal discharge scant, cultures negative      Elevated BP    Well controlled. Encouraged heart healthy diet such as the DASH diet and exercise as tolerated.       Overweight    Encouraged DASH diet, decrease po intake and increase exercise as tolerated. Needs 7-8 hours of sleep nightly. Avoid trans fats, eat small, frequent meals every 4-5 hours with lean proteins, complex carbs and healthy fats. Minimize simple carbs      Preventative health care   Relevant Orders   CBC   Lipid panel   Comprehensive metabolic panel   TSH    Other Visit Diagnoses    Encounter for preconception consultation    -  Primary    Relevant Orders    Ambulatory referral to Gynecology    Cervicovaginal ancillary only (Completed)    Pap smear for cervical cancer screening        Relevant Orders    Cytology - PAP (Completed)    Cervicovaginal ancillary only (Completed)    Vaginitis and vulvovaginitis        Relevant Orders    Cervicovaginal ancillary only (Completed)    Cervicovaginal ancillary only (Completed)       I have discontinued Ms. Siek's escitalopram, ALPRAZolam, azithromycin, clindamycin, and HYDROcodone-acetaminophen. I am also having her maintain her multivitamin, folic acid, vitamin 0000000, cetirizine, and VITAMIN A PO.  Meds ordered this encounter  Medications  . DISCONTD: Prenatal Vit-Fe  Fumarate-FA (PRENATAL COMPLETE) 14-0.4 MG TABS    Sig: Take 1 tablet by mouth daily.    Dispense:  30 each    Refill:  3     Penni Homans, MD

## 2015-08-11 NOTE — Assessment & Plan Note (Signed)
Well controlled. Encouraged heart healthy diet such as the DASH diet and exercise as tolerated.  

## 2015-08-11 NOTE — Assessment & Plan Note (Signed)
Pap today, no concerns on exam. Vaginal discharge scant, cultures negative

## 2015-08-11 NOTE — Assessment & Plan Note (Signed)
Encouraged DASH diet, decrease po intake and increase exercise as tolerated. Needs 7-8 hours of sleep nightly. Avoid trans fats, eat small, frequent meals every 4-5 hours with lean proteins, complex carbs and healthy fats. Minimize simple carbs 

## 2015-08-15 ENCOUNTER — Encounter: Payer: Self-pay | Admitting: Family Medicine

## 2015-08-16 ENCOUNTER — Other Ambulatory Visit: Payer: Self-pay | Admitting: Family Medicine

## 2015-08-16 MED ORDER — PRENATAL PLUS 27-1 MG PO TABS: 1.0000 | ORAL_TABLET | Freq: Every day | ORAL | Status: AC

## 2015-08-28 ENCOUNTER — Ambulatory Visit (INDEPENDENT_AMBULATORY_CARE_PROVIDER_SITE_OTHER): Payer: 59 | Admitting: Medical

## 2015-08-28 VITALS — BP 122/80 | HR 111 | Temp 99.1°F | Ht 67.0 in | Wt 241.0 lb

## 2015-08-28 DIAGNOSIS — R509 Fever, unspecified: Secondary | ICD-10-CM | POA: Diagnosis not present

## 2015-08-28 DIAGNOSIS — R05 Cough: Secondary | ICD-10-CM | POA: Diagnosis not present

## 2015-08-28 DIAGNOSIS — M791 Myalgia: Secondary | ICD-10-CM | POA: Diagnosis not present

## 2015-08-28 DIAGNOSIS — J101 Influenza due to other identified influenza virus with other respiratory manifestations: Secondary | ICD-10-CM | POA: Diagnosis not present

## 2015-08-28 DIAGNOSIS — IMO0001 Reserved for inherently not codable concepts without codable children: Secondary | ICD-10-CM

## 2015-08-28 DIAGNOSIS — M609 Myositis, unspecified: Secondary | ICD-10-CM

## 2015-08-28 DIAGNOSIS — R059 Cough, unspecified: Secondary | ICD-10-CM

## 2015-08-28 LAB — POCT INFLUENZA A/B
Influenza A, POC: POSITIVE — AB
Influenza B, POC: NEGATIVE

## 2015-08-28 MED ORDER — FLUTICASONE PROPIONATE 50 MCG/ACT NA SUSP: 2.0000 | Freq: Every day | NASAL | Status: AC

## 2015-08-28 MED ORDER — ALBUTEROL SULFATE HFA 108 (90 BASE) MCG/ACT IN AERS: 2.0000 | INHALATION_SPRAY | Freq: Four times a day (QID) | RESPIRATORY_TRACT | Status: AC | PRN

## 2015-08-28 MED ORDER — OSELTAMIVIR PHOSPHATE 75 MG PO CAPS: 75.0000 mg | ORAL_CAPSULE | Freq: Two times a day (BID) | ORAL | Status: AC

## 2015-08-28 MED ORDER — BENZONATATE 100 MG PO CAPS: 100.0000 mg | ORAL_CAPSULE | Freq: Three times a day (TID) | ORAL | Status: AC | PRN

## 2015-08-28 MED ORDER — AZITHROMYCIN 250 MG PO TABS: ORAL_TABLET | ORAL | Status: AC

## 2015-08-28 NOTE — Progress Notes (Signed)
Subjective:    Patient ID: Sara Vincent, female    DOB: 09-14-1980, 35 y.o.   MRN: [id number]  HPI  Pt in states early Sunday morning woke up mild itchy throat. Some ha since then and bodyaches. Pt states body aches started on Sunday nigh. Upset stomach. Pt has a lot of cough and will dry heave after cough episodes. Pt had fever last 2 nights. Pt has some mild wheeze with cough. Pt did not get flu vaccine this year.  Pt has some sinus pressure as well.  LMP- August 16, 2015.   Review of Systems  Constitutional: Positive for fatigue. Negative for chills.  HENT: Positive for sinus pressure and sore throat. Negative for congestion.   Respiratory: Positive for cough. Negative for chest tightness, shortness of breath and wheezing.        Some productive.  Gastrointestinal: Positive for vomiting and abdominal pain. Negative for nausea and diarrhea.       See hpi.  She did dry heave after severe cough episodes.   Musculoskeletal: Positive for myalgias.  Neurological: Positive for headaches.  Hematological: Negative for adenopathy. Does not bruise/bleed easily.  Psychiatric/Behavioral: Negative for behavioral problems and confusion.   Past Medical History  Diagnosis Date  . Chicken pox as a child  . Anemia   . Overweight(278.02) 08/10/2011  . Elevated BP 08/10/2011  . Otitis externa of left ear 10/16/2011  . Anxiety 06/29/2012  . Depression with anxiety 06/29/2012  . Urticaria 08/14/2012  . URI (upper respiratory infection) 09/04/2012    Social History   Social History  . Marital Status: Single    Spouse Name: N/A  . Number of Children: N/A  . Years of Education: N/A   Occupational History  . Not on file.   Social History Main Topics  . Smoking status: Never Smoker   . Smokeless tobacco: Never Used  . Alcohol Use: Yes     Comment: occasionally  . Drug Use: No  . Sexual Activity: No   Other Topics Concern  . Not on file   Social History Narrative    Past Surgical  History  Procedure Laterality Date  . Dilation and curettage of uterus  01-2006    Family History  Problem Relation Age of Onset  . Hypertension Mother   . Cancer Father 54    prostate- remission- surgery to remove  . Heart disease Maternal Grandfather   . Hypertension Maternal Grandfather   . Hyperlipidemia Maternal Grandfather   . Stroke Paternal Grandmother   . Cancer Maternal Aunt     breast  . Cancer Paternal Aunt     breast    No Known Allergies  Current Outpatient Prescriptions on File Prior to Visit  Medication Sig Dispense Refill  . folic acid (FOLVITE) 1 MG tablet Take 1 mg by mouth daily.    . multivitamin (THERAGRAN) per tablet Take 1 tablet by mouth daily.    . prenatal vitamin w/FE, FA (PRENATAL 1 + 1) 27-1 MG TABS tablet Take 1 tablet by mouth daily. 30 each 6  . VITAMIN A PO Take 1 tablet by mouth daily.    . vitamin B-12 (CYANOCOBALAMIN) 500 MCG tablet Take 500 mcg by mouth daily.    . cetirizine (ZYRTEC) 10 MG tablet Take 1 tablet (10 mg total) by mouth daily. (Patient not taking: Reported on 08/01/2015) 30 tablet 11   No current facility-administered medications on file prior to visit.    BP 122/80 mmHg  Pulse 111  Temp(Src) 99.1 F (37.3 C) (Oral)  Ht 5\' 7"  (1.702 m)  Wt 241 lb (109.317 kg)  BMI 37.74 kg/m2  SpO2 97%  LMP 08/16/2015       Objective:   Physical Exam  General  Mental Status - Alert. General Appearance - Well groomed. Not in acute distress.  Skin Rashes- No Rashes.  HEENT Head- Normal. Ear Auditory Canal - Left- Normal. Right - Normal.Tympanic Membrane- Left- Normal. Right- Normal. Eye Sclera/Conjunctiva- Left- Normal. Right- Normal. Nose & Sinuses Nasal Mucosa- Left-  Boggy and Congested. Right-  Boggy and  Congested.Bilateral no maxillary but  frontal sinus pressure. Mouth & Throat Lips: Upper Lip- Normal: no dryness, cracking, pallor, cyanosis, or vesicular eruption. Lower Lip-Normal: no dryness, cracking, pallor,  cyanosis or vesicular eruption. Buccal Mucosa- Bilateral- No Aphthous ulcers. Oropharynx- No Discharge or Erythema. Tonsils: Characteristics- Bilateral- No Erythema or Congestion. Size/Enlargement- Bilateral- No enlargement. Discharge- bilateral-None.  Neck Neck- Supple. No Masses.   Chest and Lung Exam Auscultation: Breath Sounds:-Clear even and unlabored.  Cardiovascular Auscultation:Rythm- Regular, rate and rhythm. Murmurs & Other Heart Sounds:Ausculatation of the heart reveal- No Murmurs.  Lymphatic Head & Neck General Head & Neck Lymphatics: Bilateral: Description- No Localized lymphadenopathy.   Abdomen Inspection:-Inspection Normal.  Palpation/Perucssion: Palpation and Percussion of the abdomen reveal- Non Tender, No Rebound tenderness, No rigidity(Guarding) and No Palpable abdominal masses.  Liver:-Normal.  Spleen:- Normal.          Assessment & Plan:  You have the flu. I am treating you with tamiflu. Rest, hydrate and take tylenol for fever. Alternate ibuprofen as discussed  if necessary for body ache or fever. Your frontal sinus pressure may represent secondary infection. Will go ahead and give azithromycin antibiotic  For cough benzonatate. For nasal congestion flonase.  For any wheezing did send in albuterol to use if needed  The antibiotic may upset your stomach. Would recommend getting probiotics while on antibiotic.  Follow up in 7-10 days or as needed.

## 2015-08-28 NOTE — Progress Notes (Signed)
Pre visit review using our clinic review tool, if applicable. No additional management support is needed unless otherwise documented below in the visit note. 

## 2015-08-28 NOTE — Patient Instructions (Addendum)
You have the flu. I am treating you with tamiflu. Rest, hydrate and take tylenol for fever. Alternate ibuprofen as discussed  if necessary for body ache or fever. Your frontal sinus pressure may represent secondary infection. Will go ahead and give azithromycin antibiotic  For cough benzonatate. For nasal congestion flonase.  For any wheezing did send in albuterol to use if needed  The antibiotic may upset your stomach. Would recommend getting probiotics while on antibiotic.  Follow up in 7-10 days or as needed.

## 2015-09-12 ENCOUNTER — Encounter: Payer: Self-pay | Admitting: Family Medicine

## 2016-01-23 IMAGING — US US OB COMP LESS 14 WK
1 series · 13 of 28 positions shown · non-contrast
Comparison: 09/01/2012 CT scan

CLINICAL DATA: First trimester vaginal bleeding, with spotting
starting at 11 a.m. today

EXAM:
OBSTETRIC <14 WK US AND TRANSVAGINAL OB US
TECHNIQUE: Both transabdominal and transvaginal ultrasound examinations were
performed for complete evaluation of the gestation as well as the
maternal uterus, adnexal regions, and pelvic cul-de-sac.
Transvaginal technique was performed to assess early pregnancy.

[Series 1: us ob comp less 14 wks · 13 of 50 slices shown]
[im 2/50]
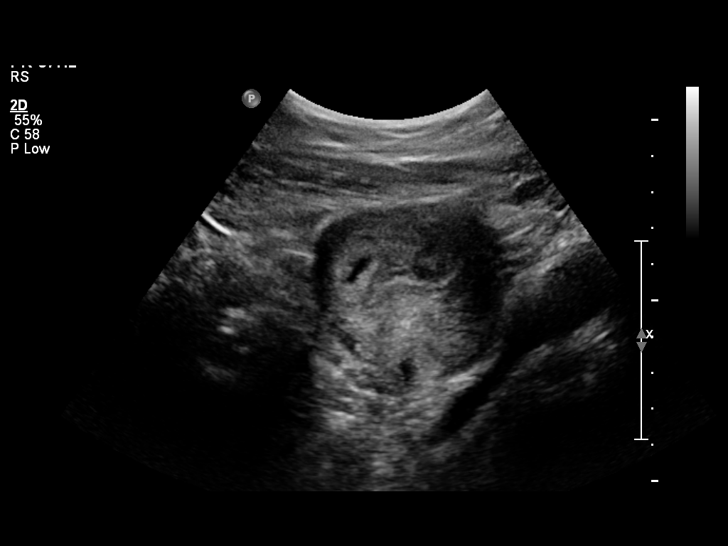
[im 6/50]
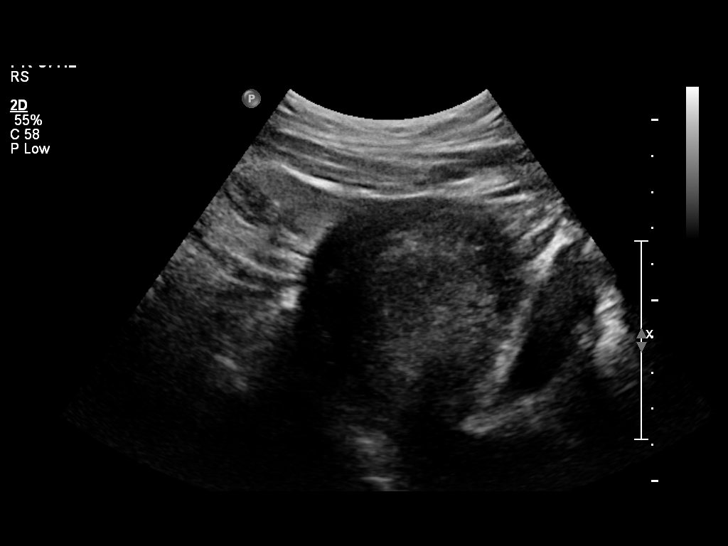
[im 10/50]
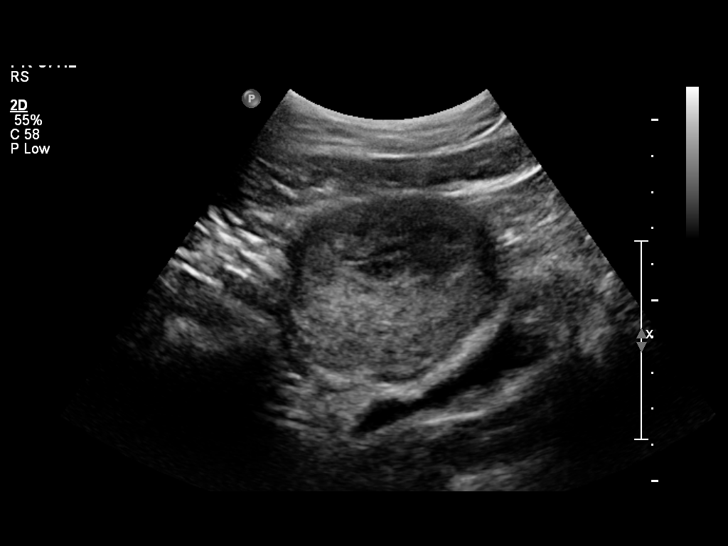
[im 13/50]
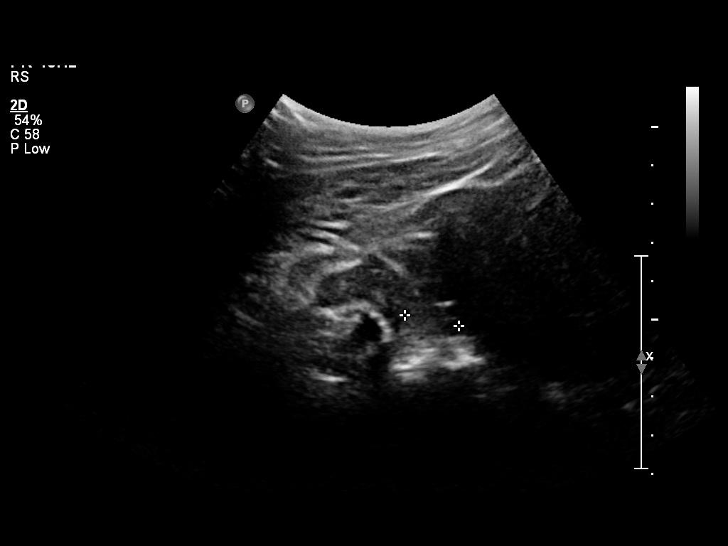
[im 17/50]
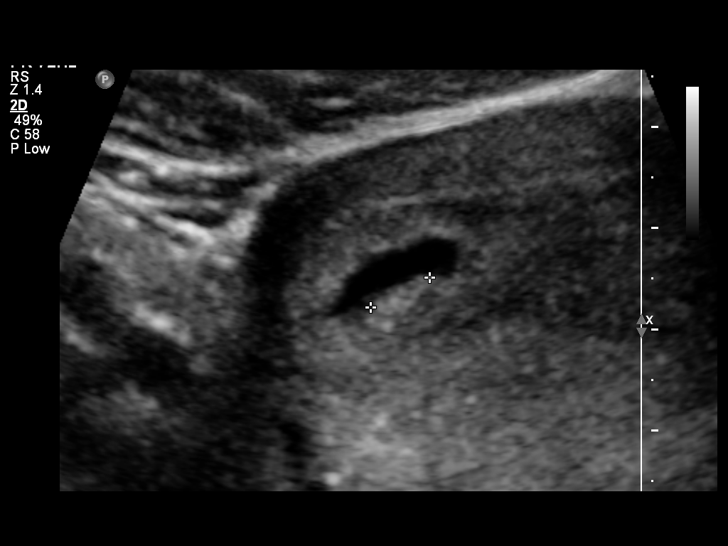
[im 20/50]
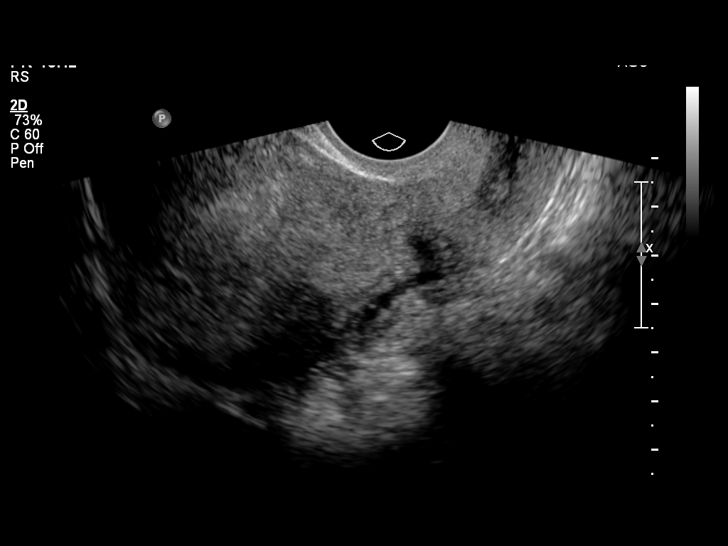
[im 26/50]
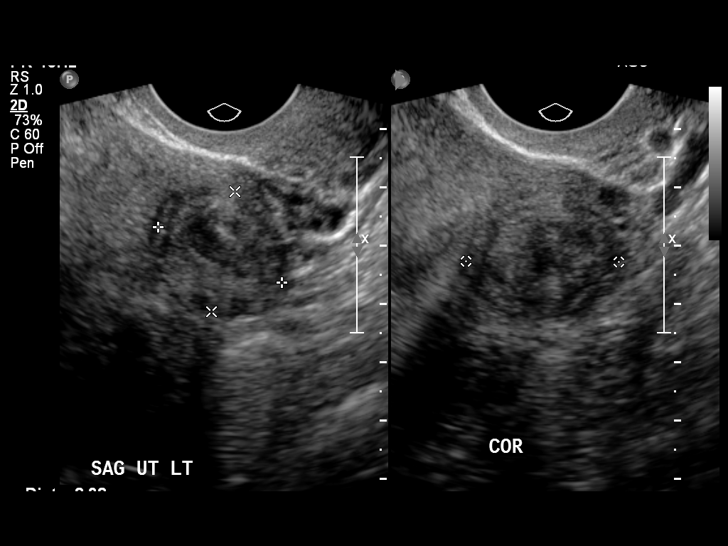
[im 30/50]
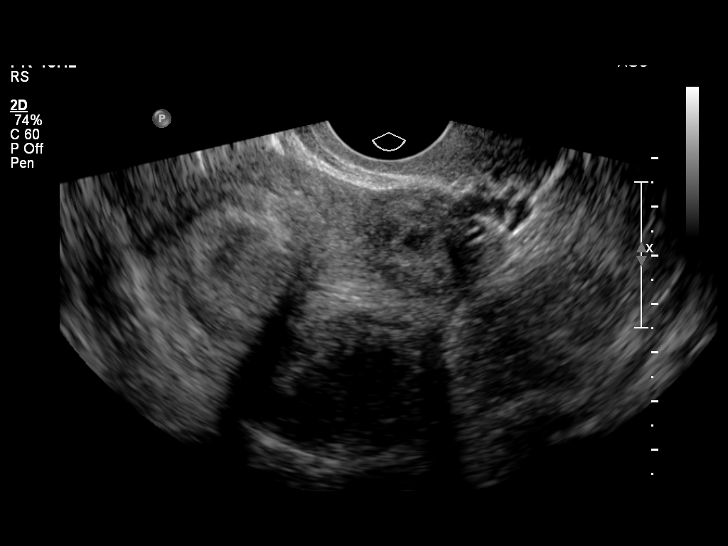
[im 33/50]
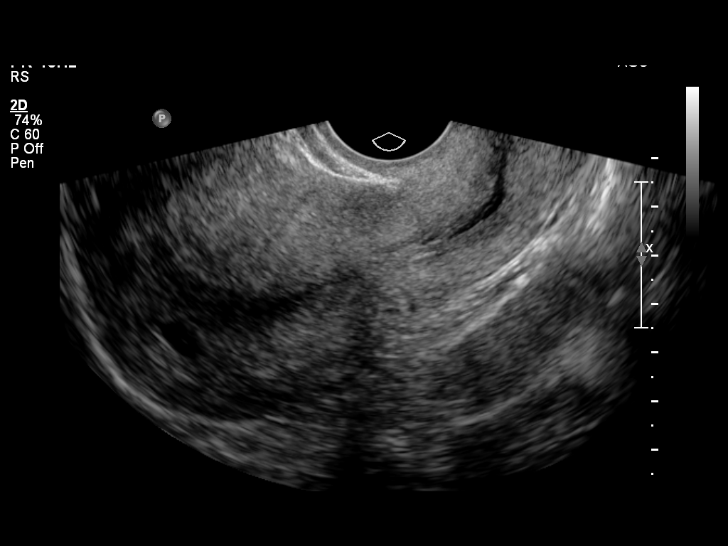
[im 37/50]
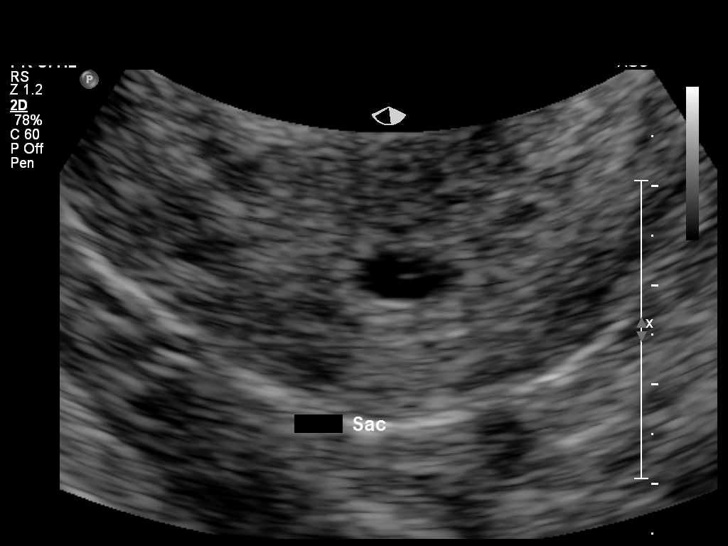
[im 40/50]
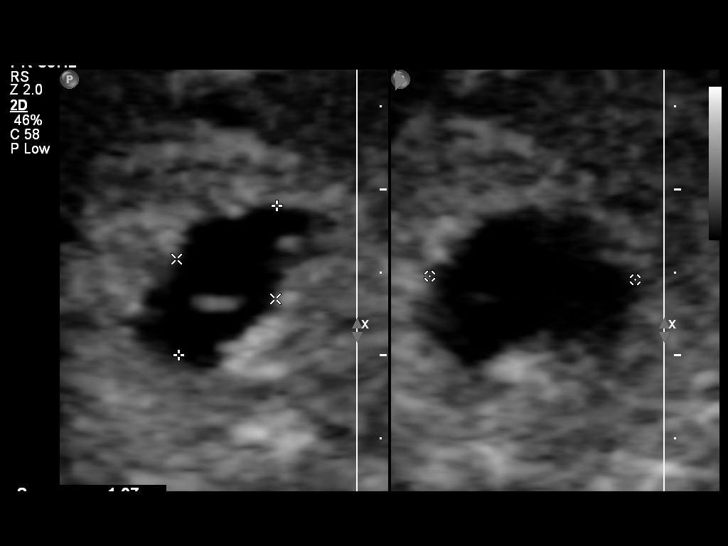
[im 44/50]
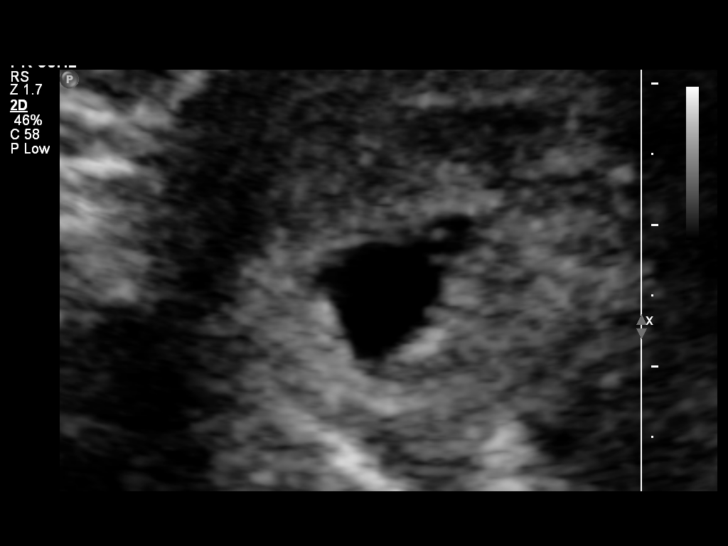
[im 48/50]
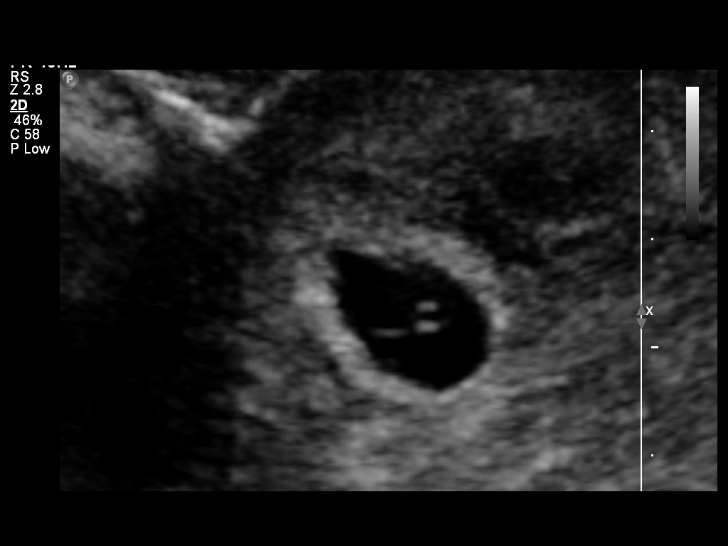

[13 of 28 positions shown; findings below may reference images not displayed]

FINDINGS: Intrauterine gestational sac: Visualized, single

Yolk sac:  Present

Embryo:  Present

Cardiac Activity: Absent

MSD:  1 cm, 5 weeks 5 days

CRL:   4  mm   6 w 1 d                  US EDC: 01/23/2015

Maternal uterus/adnexae: Both ovaries unremarkable. No subchorionic
hemorrhage or free fluid. Anterior antrum row uterine fibroid 2.6 by
2.3 by 2.1 cm. Posterior intramural fibroid 4.0 by 2.8 by 3.6 cm.
IMPRESSION: 1. Single intrauterine gestational sac with fetal pole visible but
no cardiac activity at this time. Mean sac diameter and crown-rump
length place the pregnancy at about 6 weeks. The lack of visible
cardiac activity is not necessarily abnormal at this size, and
followup sonography and correlation with quantitative beta HCG trend
is suggested to confirm viability.

## 2016-03-26 ENCOUNTER — Encounter: Payer: Self-pay | Admitting: Family Medicine

## 2017-08-19 ENCOUNTER — Encounter: Payer: Self-pay | Admitting: Family Medicine

## 2019-06-12 ENCOUNTER — Ambulatory Visit: Payer: Self-pay | Admitting: *Deleted

## 2019-06-12 NOTE — Telephone Encounter (Signed)
Pt reports lip swelling and "Throat feels like it's catching at times." States throat scratchy. Reports used hair product last night "Maybe the chemicals." States took benadryl, went to work, "worsening with the mask on." Pt directed to [facility]. States will follow disposition. No PCP  Reason for Disposition . [1] Severe swelling AND [2] cause unknown  Answer Assessment - Initial Assessment Questions 1. ONSET: "When did the swelling start?" (e.g., minutes, hours, days)    This AM 2. SEVERITY: "How swollen is it?"     "big" 3. ITCHING: "Is there any itching?" If so, ask: "How much?"   (Scale 1-10; mild, moderate or severe)     On thighs 4. PAIN: "Is the swelling painful to touch?" If so, ask: "How painful is it?"   (Scale 1-10; mild, moderate or severe)     no 5. CAUSE: "What do you think is causing the lip swelling?"     may be reaction from hair chemical 6. RECURRENT SYMPTOM: "Have you had lip swelling before?" If so, ask: "When was the last time?" "What happened that time?"     no 7. OTHER SYMPTOMS: "Do you have any other symptoms?" (e.g., toothache)     "Throat feels like it's catching"  Protocols used: LIP SWELLING-A-AH
